# Patient Record
Sex: Male | Born: 1945 | Smoking: Former smoker
Health system: Southern US, Community
[De-identification: ages and names within clinical notes are randomized; demographics above are authoritative.]

## PROBLEM LIST (undated history)

## (undated) DIAGNOSIS — I10 Essential (primary) hypertension: Secondary | ICD-10-CM

## (undated) DIAGNOSIS — R0600 Dyspnea, unspecified: Secondary | ICD-10-CM

## (undated) DIAGNOSIS — I509 Heart failure, unspecified: Secondary | ICD-10-CM

## (undated) DIAGNOSIS — J439 Emphysema, unspecified: Secondary | ICD-10-CM

## (undated) DIAGNOSIS — R0602 Shortness of breath: Secondary | ICD-10-CM

## (undated) DIAGNOSIS — J449 Chronic obstructive pulmonary disease, unspecified: Secondary | ICD-10-CM

## (undated) DIAGNOSIS — M109 Gout, unspecified: Secondary | ICD-10-CM

## (undated) HISTORY — DX: Dyspnea, unspecified: R06.00

## (undated) HISTORY — PX: ABDOMINAL SURGERY: SHX537

## (undated) HISTORY — DX: Shortness of breath: R06.02

## (undated) HISTORY — PX: ARTERIAL BYPASS SURGRY: SHX557

## (undated) HISTORY — DX: Gout, unspecified: M10.9

---

## 2007-11-11 HISTORY — PX: CARDIAC CATHETERIZATION: SHX172

## 2008-04-05 ENCOUNTER — Ambulatory Visit: Payer: Self-pay | Admitting: Cardiology

## 2008-04-05 ENCOUNTER — Ambulatory Visit: Payer: Self-pay | Admitting: Internal Medicine

## 2008-04-05 DIAGNOSIS — D509 Iron deficiency anemia, unspecified: Secondary | ICD-10-CM

## 2008-04-05 DIAGNOSIS — I509 Heart failure, unspecified: Secondary | ICD-10-CM | POA: Insufficient documentation

## 2008-04-05 DIAGNOSIS — Z8679 Personal history of other diseases of the circulatory system: Secondary | ICD-10-CM | POA: Insufficient documentation

## 2008-04-05 DIAGNOSIS — R1084 Generalized abdominal pain: Secondary | ICD-10-CM

## 2008-04-05 LAB — CONVERTED CEMR LAB
ALT: 23 units/L (ref 0–53)
Amylase: 124 units/L (ref 27–131)
Basophils Absolute: 0.1 10*3/uL (ref 0.0–0.1)
Basophils Relative: 1.3 % — ABNORMAL HIGH (ref 0.0–1.0)
Bilirubin Urine: NEGATIVE
Calcium: 9.7 mg/dL (ref 8.4–10.5)
Creatinine, Ser: 1.1 mg/dL (ref 0.4–1.5)
Eosinophils Absolute: 0.1 10*3/uL (ref 0.0–0.7)
GFR calc Af Amer: 88 mL/min
GFR calc non Af Amer: 73 mL/min
H Pylori IgG: NEGATIVE
Hemoglobin: 13 g/dL (ref 13.0–17.0)
Ketones, ur: NEGATIVE mg/dL
MCHC: 34.2 g/dL (ref 30.0–36.0)
MCV: 94.6 fL (ref 78.0–100.0)
Monocytes Absolute: 0.7 10*3/uL (ref 0.1–1.0)
Neutro Abs: 6.2 10*3/uL (ref 1.4–7.7)
RBC: 4 M/uL — ABNORMAL LOW (ref 4.22–5.81)
Specific Gravity, Urine: 1.01 (ref 1.000–1.03)
TSH: 0.23 microintl units/mL — ABNORMAL LOW (ref 0.35–5.50)
Total Bilirubin: 0.6 mg/dL (ref 0.3–1.2)
Urine Glucose: NEGATIVE mg/dL
pH: 6 (ref 5.0–8.0)

## 2008-04-06 ENCOUNTER — Telehealth: Payer: Self-pay | Admitting: Internal Medicine

## 2008-05-05 ENCOUNTER — Encounter: Payer: Self-pay | Admitting: Internal Medicine

## 2014-09-07 ENCOUNTER — Emergency Department (HOSPITAL_COMMUNITY): Payer: Medicare Other

## 2014-09-07 ENCOUNTER — Encounter (HOSPITAL_COMMUNITY): Payer: Self-pay | Admitting: Emergency Medicine

## 2014-09-07 ENCOUNTER — Inpatient Hospital Stay (HOSPITAL_COMMUNITY)
Admission: EM | Admit: 2014-09-07 | Discharge: 2014-09-09 | DRG: 291 | Disposition: A | Discharge status: Home / Self Care | Payer: Medicare Other | Attending: Internal Medicine | Admitting: Internal Medicine

## 2014-09-07 DIAGNOSIS — I5043 Acute on chronic combined systolic (congestive) and diastolic (congestive) heart failure: Principal | ICD-10-CM | POA: Diagnosis present

## 2014-09-07 DIAGNOSIS — Z87891 Personal history of nicotine dependence: Secondary | ICD-10-CM | POA: Diagnosis not present

## 2014-09-07 DIAGNOSIS — E785 Hyperlipidemia, unspecified: Secondary | ICD-10-CM | POA: Diagnosis present

## 2014-09-07 DIAGNOSIS — Z9114 Patient's other noncompliance with medication regimen: Secondary | ICD-10-CM | POA: Diagnosis present

## 2014-09-07 DIAGNOSIS — N189 Chronic kidney disease, unspecified: Secondary | ICD-10-CM | POA: Diagnosis present

## 2014-09-07 DIAGNOSIS — N179 Acute kidney failure, unspecified: Secondary | ICD-10-CM | POA: Diagnosis present

## 2014-09-07 DIAGNOSIS — R0602 Shortness of breath: Secondary | ICD-10-CM

## 2014-09-07 DIAGNOSIS — I10 Essential (primary) hypertension: Secondary | ICD-10-CM | POA: Diagnosis present

## 2014-09-07 DIAGNOSIS — I5023 Acute on chronic systolic (congestive) heart failure: Secondary | ICD-10-CM

## 2014-09-07 DIAGNOSIS — J449 Chronic obstructive pulmonary disease, unspecified: Secondary | ICD-10-CM | POA: Diagnosis present

## 2014-09-07 DIAGNOSIS — J9601 Acute respiratory failure with hypoxia: Secondary | ICD-10-CM | POA: Diagnosis present

## 2014-09-07 DIAGNOSIS — I739 Peripheral vascular disease, unspecified: Secondary | ICD-10-CM | POA: Diagnosis present

## 2014-09-07 DIAGNOSIS — Z79899 Other long term (current) drug therapy: Secondary | ICD-10-CM | POA: Diagnosis not present

## 2014-09-07 DIAGNOSIS — D509 Iron deficiency anemia, unspecified: Secondary | ICD-10-CM

## 2014-09-07 DIAGNOSIS — Z7982 Long term (current) use of aspirin: Secondary | ICD-10-CM

## 2014-09-07 DIAGNOSIS — I429 Cardiomyopathy, unspecified: Secondary | ICD-10-CM | POA: Diagnosis present

## 2014-09-07 DIAGNOSIS — I5031 Acute diastolic (congestive) heart failure: Secondary | ICD-10-CM

## 2014-09-07 DIAGNOSIS — I129 Hypertensive chronic kidney disease with stage 1 through stage 4 chronic kidney disease, or unspecified chronic kidney disease: Secondary | ICD-10-CM | POA: Diagnosis present

## 2014-09-07 DIAGNOSIS — I509 Heart failure, unspecified: Secondary | ICD-10-CM

## 2014-09-07 DIAGNOSIS — I517 Cardiomegaly: Secondary | ICD-10-CM

## 2014-09-07 HISTORY — DX: Emphysema, unspecified: J43.9

## 2014-09-07 HISTORY — DX: Heart failure, unspecified: I50.9

## 2014-09-07 HISTORY — DX: Chronic obstructive pulmonary disease, unspecified: J44.9

## 2014-09-07 HISTORY — DX: Essential (primary) hypertension: I10

## 2014-09-07 LAB — COMPREHENSIVE METABOLIC PANEL
ALBUMIN: 3.8 g/dL (ref 3.5–5.2)
ALT: 18 U/L (ref 0–53)
AST: 20 U/L (ref 0–37)
Alkaline Phosphatase: 62 U/L (ref 39–117)
Anion gap: 13 (ref 5–15)
BUN: 18 mg/dL (ref 6–23)
CO2: 25 mEq/L (ref 19–32)
CREATININE: 1.61 mg/dL — AB (ref 0.50–1.35)
Calcium: 9.5 mg/dL (ref 8.4–10.5)
Chloride: 98 mEq/L (ref 96–112)
GFR calc Af Amer: 49 mL/min — ABNORMAL LOW (ref >90)
GFR calc non Af Amer: 43 mL/min — ABNORMAL LOW (ref >90)
Glucose, Bld: 131 mg/dL — ABNORMAL HIGH (ref 70–99)
Potassium: 3.6 mEq/L — ABNORMAL LOW (ref 3.7–5.3)
SODIUM: 136 meq/L — AB (ref 137–147)
Total Bilirubin: 0.7 mg/dL (ref 0.3–1.2)
Total Protein: 8.2 g/dL (ref 6.0–8.3)

## 2014-09-07 LAB — CBC WITH DIFFERENTIAL/PLATELET
Basophils Absolute: 0 10*3/uL (ref 0.0–0.1)
Basophils Relative: 0 % (ref 0–1)
EOS PCT: 1 % (ref 0–5)
Eosinophils Absolute: 0.1 10*3/uL (ref 0.0–0.7)
HCT: 38.5 % — ABNORMAL LOW (ref 39.0–52.0)
HEMOGLOBIN: 12.9 g/dL — AB (ref 13.0–17.0)
LYMPHS ABS: 1.3 10*3/uL (ref 0.7–4.0)
LYMPHS PCT: 12 % (ref 12–46)
MCH: 31.4 pg (ref 26.0–34.0)
MCHC: 33.5 g/dL (ref 30.0–36.0)
MCV: 93.7 fL (ref 78.0–100.0)
MONOS PCT: 7 % (ref 3–12)
Monocytes Absolute: 0.7 10*3/uL (ref 0.1–1.0)
Neutro Abs: 8.8 10*3/uL — ABNORMAL HIGH (ref 1.7–7.7)
Neutrophils Relative %: 80 % — ABNORMAL HIGH (ref 43–77)
Platelets: 347 10*3/uL (ref 150–400)
RBC: 4.11 MIL/uL — ABNORMAL LOW (ref 4.22–5.81)
RDW: 13.9 % (ref 11.5–15.5)
WBC: 10.9 10*3/uL — AB (ref 4.0–10.5)

## 2014-09-07 LAB — CBC
HCT: 34.9 % — ABNORMAL LOW (ref 39.0–52.0)
Hemoglobin: 11.5 g/dL — ABNORMAL LOW (ref 13.0–17.0)
MCH: 31.3 pg (ref 26.0–34.0)
MCHC: 33 g/dL (ref 30.0–36.0)
MCV: 95.1 fL (ref 78.0–100.0)
Platelets: 279 10*3/uL (ref 150–400)
RBC: 3.67 MIL/uL — ABNORMAL LOW (ref 4.22–5.81)
RDW: 13.9 % (ref 11.5–15.5)
WBC: 10.1 10*3/uL (ref 4.0–10.5)

## 2014-09-07 LAB — PRO B NATRIURETIC PEPTIDE
PRO B NATRI PEPTIDE: 3235 pg/mL — AB (ref 0–125)
Pro B Natriuretic peptide (BNP): 2357 pg/mL — ABNORMAL HIGH (ref 0–125)

## 2014-09-07 LAB — BASIC METABOLIC PANEL
Anion gap: 14 (ref 5–15)
BUN: 18 mg/dL (ref 6–23)
CO2: 20 mEq/L (ref 19–32)
Calcium: 8.7 mg/dL (ref 8.4–10.5)
Chloride: 101 mEq/L (ref 96–112)
Creatinine, Ser: 1.23 mg/dL (ref 0.50–1.35)
GFR calc Af Amer: 68 mL/min — ABNORMAL LOW (ref >90)
GFR calc non Af Amer: 59 mL/min — ABNORMAL LOW (ref >90)
Glucose, Bld: 165 mg/dL — ABNORMAL HIGH (ref 70–99)
Potassium: 3.7 mEq/L (ref 3.7–5.3)
Sodium: 135 mEq/L — ABNORMAL LOW (ref 137–147)

## 2014-09-07 LAB — PROTIME-INR
INR: 1.19 (ref 0.00–1.49)
PROTHROMBIN TIME: 15.3 s — AB (ref 11.6–15.2)

## 2014-09-07 LAB — TROPONIN I
Troponin I: <0.3 ng/mL (ref <0.30)
Troponin I: <0.3 ng/mL (ref <0.30)

## 2014-09-07 LAB — APTT: aPTT: 36 seconds (ref 24–37)

## 2014-09-07 LAB — MAGNESIUM: MAGNESIUM: 2.1 mg/dL (ref 1.5–2.5)

## 2014-09-07 LAB — TSH: TSH: 0.189 u[IU]/mL — AB (ref 0.350–4.500)

## 2014-09-07 MED ORDER — ENOXAPARIN SODIUM 40 MG/0.4ML ~~LOC~~ SOLN
40.0000 mg | SUBCUTANEOUS | Status: DC
  Administered 2014-09-08: 40 mg via SUBCUTANEOUS
  Filled 2014-09-07 (×2): qty 0.4

## 2014-09-07 MED ORDER — FUROSEMIDE 10 MG/ML IJ SOLN
40.0000 mg | Freq: Every day | INTRAMUSCULAR | Status: DC
  Administered 2014-09-07: 40 mg via INTRAVENOUS
  Filled 2014-09-07 (×2): qty 4

## 2014-09-07 MED ORDER — SODIUM CHLORIDE 0.9 % IJ SOLN: 3.0000 mL | INTRAMUSCULAR | Status: DC | PRN

## 2014-09-07 MED ORDER — POTASSIUM CHLORIDE CRYS ER 20 MEQ PO TBCR
20.0000 meq | EXTENDED_RELEASE_TABLET | Freq: Every day | ORAL | Status: DC
  Administered 2014-09-07 – 2014-09-09 (×3): 20 meq via ORAL
  Filled 2014-09-07 (×3): qty 1

## 2014-09-07 MED ORDER — SIMVASTATIN 20 MG PO TABS
20.0000 mg | ORAL_TABLET | Freq: Every day | ORAL | Status: DC
  Administered 2014-09-07 – 2014-09-09 (×3): 20 mg via ORAL
  Filled 2014-09-07 (×3): qty 1

## 2014-09-07 MED ORDER — SODIUM CHLORIDE 0.9 % IJ SOLN
3.0000 mL | Freq: Two times a day (BID) | INTRAMUSCULAR | Status: DC
  Administered 2014-09-07 – 2014-09-08 (×2): 3 mL via INTRAVENOUS

## 2014-09-07 MED ORDER — ONDANSETRON HCL 4 MG/2ML IJ SOLN: 4.0000 mg | Freq: Four times a day (QID) | INTRAMUSCULAR | Status: DC | PRN

## 2014-09-07 MED ORDER — IRBESARTAN 75 MG PO TABS
75.0000 mg | ORAL_TABLET | Freq: Every day | ORAL | Status: DC
  Administered 2014-09-07: 75 mg via ORAL
  Filled 2014-09-07 (×2): qty 1

## 2014-09-07 MED ORDER — SODIUM CHLORIDE 0.9 % IV SOLN: 250.0000 mL | INTRAVENOUS | Status: DC | PRN

## 2014-09-07 MED ORDER — IPRATROPIUM-ALBUTEROL 0.5-2.5 (3) MG/3ML IN SOLN
3.0000 mL | RESPIRATORY_TRACT | Status: DC
  Administered 2014-09-07: 3 mL via RESPIRATORY_TRACT
  Filled 2014-09-07: qty 3

## 2014-09-07 MED ORDER — FUROSEMIDE 10 MG/ML IJ SOLN
20.0000 mg | Freq: Once | INTRAMUSCULAR | Status: AC
  Administered 2014-09-07: 20 mg via INTRAVENOUS
  Filled 2014-09-07: qty 4

## 2014-09-07 MED ORDER — ACETAMINOPHEN 325 MG PO TABS: 650.0000 mg | ORAL_TABLET | ORAL | Status: DC | PRN

## 2014-09-07 MED ORDER — ENOXAPARIN SODIUM 30 MG/0.3ML ~~LOC~~ SOLN
30.0000 mg | SUBCUTANEOUS | Status: DC
  Administered 2014-09-07: 30 mg via SUBCUTANEOUS
  Filled 2014-09-07: qty 0.3

## 2014-09-07 MED ORDER — ASPIRIN EC 81 MG PO TBEC
162.0000 mg | DELAYED_RELEASE_TABLET | Freq: Every evening | ORAL | Status: DC
  Administered 2014-09-07 – 2014-09-08 (×2): 162 mg via ORAL
  Filled 2014-09-07 (×5): qty 2

## 2014-09-07 MED ORDER — TRIAMCINOLONE ACETONIDE 0.1 % EX CREA
1.0000 "application " | TOPICAL_CREAM | Freq: Two times a day (BID) | CUTANEOUS | Status: DC | PRN
  Administered 2014-09-07: 1 via TOPICAL
  Filled 2014-09-07: qty 15

## 2014-09-07 MED ORDER — CARVEDILOL 12.5 MG PO TABS
12.5000 mg | ORAL_TABLET | Freq: Two times a day (BID) | ORAL | Status: DC
  Administered 2014-09-07 – 2014-09-09 (×4): 12.5 mg via ORAL
  Filled 2014-09-07 (×6): qty 1

## 2014-09-07 MED ORDER — CYCLOBENZAPRINE HCL 5 MG PO TABS
5.0000 mg | ORAL_TABLET | Freq: Three times a day (TID) | ORAL | Status: DC | PRN
  Filled 2014-09-07: qty 1

## 2014-09-07 MED ORDER — FAMOTIDINE 20 MG PO TABS
20.0000 mg | ORAL_TABLET | Freq: Every day | ORAL | Status: DC
  Administered 2014-09-07 – 2014-09-09 (×3): 20 mg via ORAL
  Filled 2014-09-07 (×3): qty 1

## 2014-09-07 NOTE — H&P (Signed)
[facility] History and Physical  Robert Boyer XBJ:[id number] DOB: 06-10-1946 DOA: 09/07/2014  Referring physician: ER physician PCP: Cathlean Cower, MD   Chief Complaint: Shortness of breath  HPI:  68 year old male with past medical history of congestive heart failure, COPD, hypertension who presented to [facility] ED 09/07/2014 with worsening shortness of breath over the past few days prior to this admission associated with cough productive of occasional brownish sputum, congestion. Patient reported feeling shortness of breath at rest and especially on exertion. Patient also reported two-pillow orthopnea. Patient had associated chest tightness with coughing but no complaints of chest pain. No palpitations. No fevers or chills. No abdominal pain, nausea or vomiting. No lightheadedness or loss of consciousness. Patient reports not being on Lasix for heart failure management. He follows with the [facility] hospital.  In ED, blood pressure was 144/75, heart rate 109, RR 16-20, T max 97.6 F and oxygen saturation 94% on 2 L nasal cannula oxygen support. Blood work revealed hemoglobin of 11.5, normal kidney function, normal troponin level, BNP was 2357. Chest x-ray showed mild CHF. Patient received Lasix IV 20 mg in emergency room. He already feels better but still feels short of breath. Cardiology will see the patient in consultation.   Assessment & Plan    Principal problem: Acute diastolic CHF (congestive heart failure)  Patient was admitted for management of acute diastolic CHF. BNP on the admission is 2357. No previous values for comparison because patient follows in a [place] center.   Appreciate cardiology consult and their recommendations.  12-lead EKG showed sinus tachycardia. Initial troponin was within normal limits.  Patient already received Lasix 20 mg IV in ED. We will put order for Lasix 40 mg IV daily.  Per cardiology recommendation we can resume Coreg at half dose, 12.5 mg twice daily.  Resume irbesartan 75 mg daily.  Daily weights, strict intake and output. Replete electrolytes as needed.  Obtain 2-D echo.   May resume aspirin and statin therapy.  Active problems: Essential hypertension  Resume irbesartan 75 mg daily, resume her half dose, 12.5 mg twice daily.  DVT prophylaxis:   Lovenox subcutaneous ordered.  Radiological Exams on Admission: Dg Chest 2 View  09/07/2014   CLINICAL DATA:  Shortness of breath for 4 5 days, history of congestive failure and hypertension  EXAM: CHEST  2 VIEW  COMPARISON:  04/05/2008  FINDINGS: Cardiac shadow is within normal limits. Increased vascular congestion and mild interstitial edema is noted. No focal infiltrate or sizable effusion is seen. No bony abnormality is noted.  IMPRESSION: Mild CHF.   Electronically Signed   By: Inez Catalina M.D.   On: 09/07/2014 07:11     Code Status: Full Family Communication: Plan of care discussed with the patient and his family at the bedside Disposition Plan: Admit for further evaluation  Leisa Lenz, MD  [facility] Pager [phone]  Review of Systems:  Constitutional: Negative for fever, chills and malaise/fatigue. Negative for diaphoresis.  HENT: Negative for hearing loss, ear pain, nosebleeds, congestion, sore throat, neck pain, tinnitus and ear discharge.   Eyes: Negative for blurred vision, double vision, photophobia, pain, discharge and redness.  Respiratory: Positive for cough, sputum production, shortness of breath Cardiovascular: Positive for mild leg swelling, no palpitations and no chest pain at this time  Gastrointestinal: Negative for nausea, vomiting and abdominal pain. Negative for heartburn, constipation, blood in stool and melena.  Genitourinary: Negative for dysuria, urgency, frequency, hematuria and flank pain.  Musculoskeletal: Negative for myalgias, back pain, joint  pain and falls.  Skin: Negative for itching and rash.  Neurological: Negative for dizziness and  weakness. Negative for tingling, tremors, sensory change, speech change, focal weakness, loss of consciousness and headaches.  Endo/Heme/Allergies: Negative for environmental allergies and polydipsia. Does not bruise/bleed easily.  Psychiatric/Behavioral: Negative for suicidal ideas. The patient is not nervous/anxious.      Past Medical History  Diagnosis Date  . CHF (congestive heart failure)   . COPD (chronic obstructive pulmonary disease)   . Hypertension   . Emphysema/COPD    Past Surgical History  Procedure Laterality Date  . Arterial bypass surgry    . Abdominal surgery     Social History:  reports that he has quit smoking. He has never used smokeless tobacco. He reports that he does not drink alcohol or use illicit drugs.  Allergies  Allergen Reactions  . Penicillins Rash    Family History:  Family History  Problem Relation Age of Onset  . Cancer Mother   . Arthritis Other   . Cancer Other      Prior to Admission medications   Medication Sig Start Date End Date Taking? Authorizing Provider  aspirin EC 81 MG tablet Take 162 mg by mouth every evening.   Yes Historical Provider, MD  pseudoephedrine (SUDAFED) 30 MG tablet Take 30 mg by mouth every 4 (four) hours as needed for congestion.   Yes Historical Provider, MD  ranitidine (ZANTAC) 150 MG tablet Take 150 mg by mouth 2 (two) times daily.   Yes Historical Provider, MD  triamcinolone cream (KENALOG) 0.1 % Apply 1 application topically 2 (two) times daily as needed (for ezcema).   Yes Historical Provider, MD  carvedilol (COREG) 25 MG tablet Take 12.5 mg by mouth 2 (two) times daily with a meal.    Historical Provider, MD  hydrochlorothiazide (HYDRODIURIL) 25 MG tablet Take 25 mg by mouth daily.    Historical Provider, MD  simvastatin (ZOCOR) 20 MG tablet Take 20 mg by mouth daily.    Historical Provider, MD  valsartan (DIOVAN) 80 MG tablet Take 80 mg by mouth daily.    Historical Provider, MD   Physical Exam: Filed  Vitals:   09/07/14 0800 09/07/14 0816 09/07/14 0817 09/07/14 0947  BP: 153/88   140/86  Pulse: 113  124 112  Temp:    97.4 F (36.3 C)  TempSrc:    Oral  Resp: 26   20  Height:    5\' 6"  (1.676 m)  Weight:    78.109 kg (172 lb 3.2 oz)  SpO2: 93% 93% 90% 94%    Physical Exam  Constitutional: Appears well-developed and well-nourished. No distress.  HENT: Normocephalic. No tonsillar erythema or exudates Eyes: Conjunctivae and EOM are normal. PERRLA, no scleral icterus.  Neck: Normal ROM. Neck supple. No JVD. No tracheal deviation. No thyromegaly.  CVS: Tachycardic, S1/S2 +, no murmurs, no gallops, no carotid bruit.  Pulmonary: Appreciate wheezing and upper lung lobes, mild rales.  Abdominal: Soft. BS +,  no distension, tenderness, rebound or guarding.  Musculoskeletal: Normal range of motion. +1 lower extremity pitting edema and no tenderness.  Lymphadenopathy: No lymphadenopathy noted, cervical, inguinal. Neuro: Alert. Normal reflexes, muscle tone coordination. No focal neurologic deficits. Skin: Skin is warm and dry. No rash noted. Not diaphoretic. No erythema. No pallor.  Psychiatric: Normal mood and affect. Behavior, judgment, thought content normal.   Labs on Admission:  Basic Metabolic Panel:  Recent Labs Lab 09/07/14 0635  NA 135*  K 3.7  CL  101  CO2 20  GLUCOSE 165*  BUN 18  CREATININE 1.23  CALCIUM 8.7   Liver Function Tests: No results found for this basename: AST, ALT, ALKPHOS, BILITOT, PROT, ALBUMIN,  in the last 168 hours No results found for this basename: LIPASE, AMYLASE,  in the last 168 hours No results found for this basename: AMMONIA,  in the last 168 hours CBC:  Recent Labs Lab 09/07/14 0635  WBC 10.1  HGB 11.5*  HCT 34.9*  MCV 95.1  PLT 279   Cardiac Enzymes:  Recent Labs Lab 09/07/14 0635  TROPONINI <0.30   BNP: No components found with this basename: POCBNP,  CBG: No results found for this basename: GLUCAP,  in the last 168  hours  If 7PM-7AM, please contact night-coverage [email or web address] Password [email or web address] 09/07/2014, 11:24 AM

## 2014-09-07 NOTE — Consult Note (Signed)
Admit date: 09/07/2014 Referring Physician  Dr. Charlies Silvers Primary Physician Cathlean Cower, MD Primary Cardiologist  [facility], Dr. Leonides Sake Reason for Consultation  CHF  HPI: 68 year old male, patient of [facility] admitted with acute on chronic congestive heart failure. Over the past 5 days has been noticing increasing shortness of breath with increasing edema and cough worse with exertion and positive orthopnea. No medications over the past few months, "ran out".  In addition to prior history of heart failure, has COPD, sats were 90% at rest, nebulizer with partial improvement, BNP was elevated, IV Lasix administered.  We were consulted to assist with heart failure exacerbation.  He states that he has had 2 prior heart catheterizations, no prior PCI or stents. 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  Does not recall ejection fraction.    PMH:   Past Medical History  Diagnosis Date  . CHF (congestive heart failure)   . COPD (chronic obstructive pulmonary disease)   . Hypertension   . Emphysema/COPD     PSH:   Past Surgical History  Procedure Laterality Date  . Arterial bypass surgry    . Abdominal surgery     Allergies:  Penicillins Prior to Admit Meds:   Prior to Admission medications   Medication Sig Start Date End Date Taking? Authorizing Provider  aspirin EC 81 MG tablet Take 162 mg by mouth every evening.   Yes Historical Provider, MD  pseudoephedrine (SUDAFED) 30 MG tablet Take 30 mg by mouth every 4 (four) hours as needed for congestion.   Yes Historical Provider, MD  ranitidine (ZANTAC) 150 MG tablet Take 150 mg by mouth 2 (two) times daily.   Yes Historical Provider, MD  triamcinolone cream (KENALOG) 0.1 % Apply 1 application topically 2 (two) times daily as needed (for ezcema).   Yes Historical Provider, MD  carvedilol (COREG) 25 MG tablet Take 12.5 mg by mouth 2 (two) times daily with a meal.     Historical Provider, MD  hydrochlorothiazide (HYDRODIURIL) 25 MG tablet Take 25 mg by mouth daily.    Historical Provider, MD  simvastatin (ZOCOR) 20 MG tablet Take 20 mg by mouth daily.    Historical Provider, MD  valsartan (DIOVAN) 80 MG tablet Take 80 mg by mouth daily.    Historical Provider, MD   Fam HX:    Family History  Problem Relation Age of Onset  . Cancer Mother   . Arthritis Other   . Cancer Other    Social HX:    History   Social History  . Marital Status: Married    Spouse Name: N/A    Number of Children: N/A  . Years of Education: N/A   Occupational History  . Not on file.   Social History Main Topics  . Smoking status: Former Research scientist (life sciences)  . Smokeless tobacco: Never Used  . Alcohol Use: No  . Drug Use: No  . Sexual Activity: Not Currently   Other Topics Concern  . Not on file   Social History Narrative  . No narrative on file     ROS: Denies any bleeding, syncope, strokelike symptoms. Positive orthopnea, shortness of breath. All 11 ROS were addressed and are negative except what is stated in the HPI   Physical Exam: Blood pressure 140/86, pulse 112, temperature 97.4 F (36.3 C), temperature source Oral, resp. rate 20, height 5\' 6"  (1.676 m), weight 172 lb 3.2 oz (78.109 kg), SpO2 94.00%.  General: Well developed, well nourished, in no acute distress Head: Eyes PERRLA, No xanthomas.   Normal cephalic and atramatic  Lungs:  Mildly increased respiratory effort with minimal wheeze heard bilaterally. Heart:   Tachycardic, regular S1 S2 Pulses are 2+ & equal. No murmur, rubs, gallops.  No carotid bruit. No JVD.  No abdominal bruits.  Abdomen: Bowel sounds are positive, abdomen soft and non-tender without masses. No hepatosplenomegaly. Msk:  Back normal. Normal strength and tone for age. Extremities:  No clubbing, cyanosis, 2+ lower extremity edema.  DP +1 Neuro: Alert and oriented X 3, non-focal, MAE x 4 GU: Deferred Rectal: Deferred Psych:  Good affect,  responds appropriately      Labs: Lab Results  Component Value Date   WBC 10.1 09/07/2014   HGB 11.5* 09/07/2014   HCT 34.9* 09/07/2014   MCV 95.1 09/07/2014   PLT 279 09/07/2014     Recent Labs Lab 09/07/14 0635  NA 135*  K 3.7  CL 101  CO2 20  BUN 18  CREATININE 1.23  CALCIUM 8.7  GLUCOSE 165*    Recent Labs  09/07/14 0635  TROPONINI <0.30    Radiology:  Dg Chest 2 View  09/07/2014   CLINICAL DATA:  Shortness of breath for 4 5 days, history of congestive failure and hypertension  EXAM: CHEST  2 VIEW  COMPARISON:  04/05/2008  FINDINGS: Cardiac shadow is within normal limits. Increased vascular congestion and mild interstitial edema is noted. No focal infiltrate or sizable effusion is seen. No bony abnormality is noted.  IMPRESSION: Mild CHF.   Electronically Signed   By: Inez Catalina M.D.   On: 09/07/2014 07:11   Personally viewed.  EKG:  Sinus tachycardia rate 112 with left atrial enlargement, LVH, repolarization abnormality. Personally viewed.   ASSESSMENT/PLAN:    68 year old male with known history of chronic heart failure here with heart failure exacerbation in the setting of medication noncompliance.  1. Heart failure-we will check echocardiogram to determine systolic/diastolic. Continue with IV Lasix. Restart carvedilol and valsartan. It would not be unreasonable to start carvedilol half dose, 12.5 mg twice a day first. BNP 2300. Replete potassium as we are administering IV Lasix.  Describes prior heart catheterization 8 years ago, no percutaneous intervention, no significant CAD per [facility] Leonides Sake.  2. COPD-playing a role as well in shortness of breath. Continue treatment per primary team. Had nebulizer in emergency room.  3. Medication noncompliance-encourage use.  4. Mild anemia, hemoglobin 11.5.  5. Peripheral vascular disease-occluded distal aorta status post lower external bypass.  We will continue to follow.  He understands the  importance of medical compliance.   Candee Furbish, MD  09/07/2014  10:19 AM

## 2014-09-07 NOTE — ED Provider Notes (Signed)
CSN: [id number]     Arrival date & time 09/07/14  0551 History   First MD Initiated Contact with Patient 09/07/14 0615     Chief Complaint  Patient presents with  . Shortness of Breath     (Consider location/radiation/quality/duration/timing/severity/associated sxs/prior Treatment) HPI Pt is a 68yo male with hx of CHF, COPD, and HTN presenting to ED with c/o gradually worsening SOB over last 4-5 days with associated cough, congestion, and DOE.  Pt states he becomes winded while walking around his house and has had difficulty sleeping because he wakes up gasping for air. No previous dx of sleep apnea.  Also reports mild swelling in his feet bilaterally. Pt reports being out of his cholesterol and BP medications x1 month as he normally fills them online through the [place] but ordered them during a holiday and forgot to f/u.  Denies fever, chills, n/v/d. Reports being hospitalized 38yrs ago when he was first diagnosed with CHF.  Past Medical History  Diagnosis Date  . CHF (congestive heart failure)   . COPD (chronic obstructive pulmonary disease)   . Hypertension   . Emphysema/COPD    Past Surgical History  Procedure Laterality Date  . Arterial bypass surgry    . Abdominal surgery     Family History  Problem Relation Age of Onset  . Cancer Mother   . Arthritis Other   . Cancer Other    History  Substance Use Topics  . Smoking status: Former Research scientist (life sciences)  . Smokeless tobacco: Not on file  . Alcohol Use: No    Review of Systems  HENT: Positive for congestion.   Respiratory: Positive for cough, chest tightness and shortness of breath.   Cardiovascular: Negative for chest pain and palpitations.  Gastrointestinal: Positive for nausea. Negative for vomiting, abdominal pain and diarrhea.  All other systems reviewed and are negative.     Allergies  Penicillins  Home Medications   Prior to Admission medications   Medication Sig Start Date End Date Taking? Authorizing Provider   aspirin EC 81 MG tablet Take 162 mg by mouth every evening.   Yes Historical Provider, MD  pseudoephedrine (SUDAFED) 30 MG tablet Take 30 mg by mouth every 4 (four) hours as needed for congestion.   Yes Historical Provider, MD  ranitidine (ZANTAC) 150 MG tablet Take 150 mg by mouth 2 (two) times daily.   Yes Historical Provider, MD  triamcinolone cream (KENALOG) 0.1 % Apply 1 application topically 2 (two) times daily as needed (for ezcema).   Yes Historical Provider, MD  carvedilol (COREG) 25 MG tablet Take 12.5 mg by mouth 2 (two) times daily with a meal.    Historical Provider, MD  hydrochlorothiazide (HYDRODIURIL) 25 MG tablet Take 25 mg by mouth daily.    Historical Provider, MD  simvastatin (ZOCOR) 20 MG tablet Take 20 mg by mouth daily.    Historical Provider, MD  valsartan (DIOVAN) 80 MG tablet Take 80 mg by mouth daily.    Historical Provider, MD   BP 153/88  Pulse 124  Temp(Src) 97.6 F (36.4 C) (Oral)  Resp 26  SpO2 90% Physical Exam  Nursing note and vitals reviewed. Constitutional: He appears well-developed and well-nourished.  Pt sitting in exam bed, Pitts in place. NAD  HENT:  Head: Normocephalic and atraumatic.  Eyes: Conjunctivae are normal. No scleral icterus.  Neck: Normal range of motion.  Cardiovascular: Normal rate, regular rhythm and normal heart sounds.   Pulmonary/Chest: Effort normal. No respiratory distress. He has no wheezes.  He has rales. He exhibits no tenderness.  Nasal canula in place on 2L. No respiratory distress. Able to speak in full sentences. Decreased breath sounds in lower lung fields.  Abdominal: Soft. Bowel sounds are normal. He exhibits no distension and no mass. There is no tenderness. There is no rebound and no guarding.  Musculoskeletal: Normal range of motion. He exhibits edema. He exhibits no tenderness.  1+ pedal edema bilaterally   Neurological: He is alert.  Skin: Skin is warm and dry.    ED Course  Procedures (including critical care  time) Labs Review Labs Reviewed  CBC - Abnormal; Notable for the following:    RBC 3.67 (*)    Hemoglobin 11.5 (*)    HCT 34.9 (*)    All other components within normal limits  BASIC METABOLIC PANEL - Abnormal; Notable for the following:    Sodium 135 (*)    Glucose, Bld 165 (*)    GFR calc non Af Amer 59 (*)    GFR calc Af Amer 68 (*)    All other components within normal limits  PRO B NATRIURETIC PEPTIDE - Abnormal; Notable for the following:    Pro B Natriuretic peptide (BNP) 2357.0 (*)    All other components within normal limits  TROPONIN I    Imaging Review Dg Chest 2 View  09/07/2014   CLINICAL DATA:  Shortness of breath for 4 5 days, history of congestive failure and hypertension  EXAM: CHEST  2 VIEW  COMPARISON:  04/05/2008  FINDINGS: Cardiac shadow is within normal limits. Increased vascular congestion and mild interstitial edema is noted. No focal infiltrate or sizable effusion is seen. No bony abnormality is noted.  IMPRESSION: Mild CHF.   Electronically Signed   By: Inez Catalina M.D.   On: 09/07/2014 07:11     EKG Interpretation   Date/Time:  Thursday September 07 2014 06:00:40 EDT Ventricular Rate:  112 PR Interval:  161 QRS Duration: 97 QT Interval:  346 QTC Calculation: 472 R Axis:   16 Text Interpretation:  Sinus tachycardia Probable left atrial enlargement  LVH with secondary repolarization abnormality Baseline wander in lead(s)  V1 No old tracing to compare Confirmed by OTTER  MD, OLGA ([id number]) on  09/07/2014 6:11:13 AM      MDM   Final diagnoses:  SOB (shortness of breath)  CHF exacerbation    Pt presenting with signs and symptoms concerning for CHF exacerbation, COPD exacerbation, and/or pneumonia.  Low concern for PE.  Troponin and EKG: unremarkable. No evidence of MI.  Pt given duoneb tx while in ED which did help some with his SOB. CXR: mild CHF.  BNP- 2357. Pt does not have oxygen at home, however, while in ED, pt has needed to be on 2L [place].   Pt is 93% on RA, however, while getting an ambulatory O2, pt dropped to 90% HR up to 124 and pt reports feeling chest tightness and winded.   Discussed pt with Dr. Thurnell Garbe, will admit pt for CHF exacerbation. Pt has been given 2 duoneb tx and 20mg  IV lasix.    08:31AM consulted with Dr. Charlies Silvers, hospitalist, who agreed to examine pt and have him admitted to a tele bed for further evaluation and treatment of his CHF exacerbation. Pt is stable at this time.   Noland Fordyce, PA-C 09/07/14 [place], PA-C 09/07/14 0900

## 2014-09-07 NOTE — Progress Notes (Signed)
Echocardiogram 2D Echocardiogram has been performed.  Robert Boyer 09/07/2014, 2:38 PM

## 2014-09-07 NOTE — ED Notes (Signed)
Pt ambulated around dept with pulse ox.  Sats 93-90, hr 117-124.  Pt states he is winded on arrival back to room. PA notified.

## 2014-09-07 NOTE — ED Notes (Signed)
Pt states for the past 4 to 5 days he has been having shortness of breath  Pt has labored breathing upon arrival to triage  Pt has hx of CHF and emphysema  Pt states he has difficulty sleeping because he wakes up gasping for air  Pt states he has not been taking his blood pressure medication for about a month as he is out of it

## 2014-09-07 NOTE — ED Provider Notes (Signed)
Medical screening examination/treatment/procedure(s) were conducted as a shared visit with non-physician practitioner(s) and myself.  I personally evaluated the patient during the encounter.  68yo M, c/o increasing SOB for the past 4 to 5 days. Has been associated with peripheral edema and cough. SOB worsens on exertion and laying down flat. States he has not taken his meds in the past month because he "ran out." Pt has significant hx of COPD and CHF. A&O, NAD, lungs coarse at bases bilat, RRR, abd soft/NT, neuro non-focal, +1 pedal edema bilat. Sats 90% R/A at rest on arrival, increase to 97% on O2 2L N/C. Pt given neb tx x2 with partial improvement. CXR with mild CHF and BNP elevated; IV lasix given. Pt ambulated with Sats dropping to 90-93% R/A and pt c/o increasing SOB with increasing RR. Admit to medicine service.      EKG Interpretation   Date/Time:  Thursday September 07 2014 06:00:40 EDT Ventricular Rate:  112 PR Interval:  161 QRS Duration: 97 QT Interval:  346 QTC Calculation: 472 R Axis:   16 Text Interpretation:  Sinus tachycardia Probable left atrial enlargement  LVH with secondary repolarization abnormality Baseline wander in lead(s)  V1 No old tracing to compare Confirmed by OTTER  MD, OLGA ([id number]) on  09/07/2014 6:11:13 AM        Francine Graven, DO 09/07/14 0881

## 2014-09-08 LAB — BASIC METABOLIC PANEL
Anion gap: 14 (ref 5–15)
BUN: 24 mg/dL — ABNORMAL HIGH (ref 6–23)
CO2: 24 mEq/L (ref 19–32)
Calcium: 9.5 mg/dL (ref 8.4–10.5)
Chloride: 101 mEq/L (ref 96–112)
Creatinine, Ser: 1.67 mg/dL — ABNORMAL HIGH (ref 0.50–1.35)
GFR calc Af Amer: 47 mL/min — ABNORMAL LOW (ref >90)
GFR calc non Af Amer: 41 mL/min — ABNORMAL LOW (ref >90)
GLUCOSE: 107 mg/dL — AB (ref 70–99)
POTASSIUM: 4.2 meq/L (ref 3.7–5.3)
Sodium: 139 mEq/L (ref 137–147)

## 2014-09-08 MED ORDER — IRBESARTAN 150 MG PO TABS
150.0000 mg | ORAL_TABLET | Freq: Every day | ORAL | Status: DC
  Administered 2014-09-08 – 2014-09-09 (×2): 150 mg via ORAL
  Filled 2014-09-08 (×2): qty 1

## 2014-09-08 MED ORDER — FUROSEMIDE 20 MG PO TABS
20.0000 mg | ORAL_TABLET | Freq: Every day | ORAL | Status: DC
  Administered 2014-09-08 – 2014-09-09 (×2): 20 mg via ORAL
  Filled 2014-09-08 (×2): qty 1

## 2014-09-08 NOTE — Progress Notes (Addendum)
    Subjective:  Denies CP; dyspnea resolved   Objective:  Filed Vitals:   09/07/14 0947 09/07/14 1318 09/07/14 2047 09/08/14 0524  BP: 140/86 122/75 111/68 124/88  Pulse: 112 110 102 97  Temp: 97.4 F (36.3 C) 97.8 F (36.6 C) 97.5 F (36.4 C) 97.8 F (36.6 C)  TempSrc: Oral Oral Oral Oral  Resp: 20 20 20 20   Height: 5\' 6"  (1.676 m)     Weight: 172 lb 3.2 oz (78.109 kg)   167 lb 4.8 oz (75.887 kg)  SpO2: 94% 97% 95% 94%    Intake/Output from previous day:  Intake/Output Summary (Last 24 hours) at 09/08/14 0719 Last data filed at 09/08/14 0526  Gross per 24 hour  Intake    243 ml  Output   1475 ml  Net  -1232 ml    Physical Exam: Physical exam: Well-developed well-nourished in no acute distress.  Skin is warm and dry.  HEENT is normal.  Neck is supple.  Chest with diminished BS Cardiovascular exam is regular rate and rhythm.  Abdominal exam nontender or distended. No masses palpated. Extremities show no edema. neuro grossly intact    Lab Results: Basic Metabolic Panel:  Recent Labs  09/07/14 0635 09/07/14 1227 09/08/14 0350  NA 135* 136* 139  K 3.7 3.6* 4.2  CL 101 98 101  CO2 20 25 24   GLUCOSE 165* 131* 107*  BUN 18 18 24*  CREATININE 1.23 1.61* 1.67*  CALCIUM 8.7 9.5 9.5  MG  --  2.1  --    CBC:  Recent Labs  09/07/14 0635 09/07/14 1227  WBC 10.1 10.9*  NEUTROABS  --  8.8*  HGB 11.5* 12.9*  HCT 34.9* 38.5*  MCV 95.1 93.7  PLT 279 347   Cardiac Enzymes:  Recent Labs  09/07/14 1227 09/07/14 1543 09/07/14 2151  TROPONINI <0.30 <0.30 <0.30     Assessment/Plan:  1 acute on chronic systolic congestive heart failure-Symptoms are improving. Change lasix to 20 mg daily. 2 cardiomyopathy-ejection fraction 20% by echocardiogram. Patient states LV function has been reduced previously but I do not have records. Continue carvedilol. Increase Avapro to 150 mg daily. Titrate medications as tolerated by pulse and blood pressure as  outpatient. Obtain previous records concerning prior catheterization. Patient should FU 1-2 weeks following DC with his physicians at [facility]; if LV function chronically reduced and EF < 35, he should be considered for ICD. He likely will need nuclear study as outpt if no recent ischemia eval. 3 acute on chronic renal failure-follow renal function after DC 4 noncompliance-patient educated on importance of compliance. 5 COPD-Management per primary care. This appears to be playing a part in presentation. 6 Decreased TSH-management per primary care. Needs BMET checked one week after DC Kirk Ruths 09/08/2014, 7:19 AM

## 2014-09-08 NOTE — Progress Notes (Signed)
[facility] PROGRESS NOTE  Robert Boyer [id number] DOB: 17-Apr-1946 DOA: 09/07/2014 PCP: Cathlean Cower, MD  Assessment/Plan: Acute diastolic CHF (congestive heart failure)  Patient was admitted for management of acute diastolic CHF. BNP on the admission is 2357. No previous values for comparison because patient follows in a [place] center.  Appreciate cardiology consult and their recommendations.  12-lead EKG showed sinus tachycardia. Initial troponin was within normal limits.  Patient already received Lasix 20 mg IV in ED. Per cardiology recommendation we can resume Coreg at half dose, 12.5 mg twice daily. Resume irbesartan 75 mg daily.  Daily weights, strict intake and output. Replete electrolytes as needed.  2-D echo. Ef 20 % May resume aspirin and statin therapy. Change to oral lasix today. Request record from [place]. He has not been taking medication as he should. Needs to follow up with Cardiology for ICD.   Essential hypertension  Resume irbesartan 75 mg daily, resume her half dose, 12.5 mg twice daily.  DVT prophylaxis:  Lovenox subcutaneous ordered.  Low TSH; check Free T 3 and T 4.   Code Status: full code.  Family Communication: care discussed with patient and wife.  Disposition Plan: probably home 10-31   Consultants:  Cardiology  Procedures:  none  Antibiotics:  none  HPI/Subjective: Feeling better. No dyspnea  Objective: Filed Vitals:   09/08/14 1733  BP: 114/69  Pulse: 89  Temp:   Resp:     Intake/Output Summary (Last 24 hours) at 09/08/14 1801 Last data filed at 09/08/14 1403  Gross per 24 hour  Intake    480 ml  Output    250 ml  Net    230 ml   Filed Weights   09/07/14 0947 09/08/14 0524  Weight: 78.109 kg (172 lb 3.2 oz) 75.887 kg (167 lb 4.8 oz)    Exam:   General:  Alert in no distress.   Cardiovascular: S 1, S 2 RRR  Respiratory: CTA  Abdomen: Bs present, soft  Musculoskeletal: no edema.    Data Reviewed: Basic  Metabolic Panel:  Recent Labs Lab 09/07/14 0635 09/07/14 1227 09/08/14 0350  NA 135* 136* 139  K 3.7 3.6* 4.2  CL 101 98 101  CO2 20 25 24   GLUCOSE 165* 131* 107*  BUN 18 18 24*  CREATININE 1.23 1.61* 1.67*  CALCIUM 8.7 9.5 9.5  MG  --  2.1  --    Liver Function Tests:  Recent Labs Lab 09/07/14 1227  AST 20  ALT 18  ALKPHOS 62  BILITOT 0.7  PROT 8.2  ALBUMIN 3.8   No results found for this basename: LIPASE, AMYLASE,  in the last 168 hours No results found for this basename: AMMONIA,  in the last 168 hours CBC:  Recent Labs Lab 09/07/14 0635 09/07/14 1227  WBC 10.1 10.9*  NEUTROABS  --  8.8*  HGB 11.5* 12.9*  HCT 34.9* 38.5*  MCV 95.1 93.7  PLT 279 347   Cardiac Enzymes:  Recent Labs Lab 09/07/14 0635 09/07/14 1227 09/07/14 1543 09/07/14 2151  TROPONINI <0.30 <0.30 <0.30 <0.30   BNP (last 3 results)  Recent Labs  09/07/14 0635 09/07/14 1227  PROBNP 2357.0* 3235.0*   CBG: No results found for this basename: GLUCAP,  in the last 168 hours  No results found for this or any previous visit (from the past 240 hour(s)).   Studies: Dg Chest 2 View  09/07/2014   CLINICAL DATA:  Shortness of breath for 4 5 days, history of congestive failure  and hypertension  EXAM: CHEST  2 VIEW  COMPARISON:  04/05/2008  FINDINGS: Cardiac shadow is within normal limits. Increased vascular congestion and mild interstitial edema is noted. No focal infiltrate or sizable effusion is seen. No bony abnormality is noted.  IMPRESSION: Mild CHF.   Electronically Signed   By: Inez Catalina M.D.   On: 09/07/2014 07:11    Scheduled Meds: . aspirin EC  162 mg Oral QPM  . carvedilol  12.5 mg Oral BID WC  . enoxaparin (LOVENOX) injection  40 mg Subcutaneous Q24H  . famotidine  20 mg Oral Daily  . furosemide  20 mg Oral Daily  . irbesartan  150 mg Oral Daily  . potassium chloride  20 mEq Oral Daily  . simvastatin  20 mg Oral Daily  . sodium chloride  3 mL Intravenous Q12H    Continuous Infusions:   Principal Problem:   Acute respiratory failure with hypoxia Active Problems:   Acute diastolic CHF (congestive heart failure)   Essential hypertension   Dyslipidemia    Time spent: 25 minutes.     Niel Hummer A  [facility] Pager [phone]. If 7PM-7AM, please contact night-coverage at [email or web address], password [email or web address] 09/08/2014, 6:01 PM  LOS: 1 day

## 2014-09-09 DIAGNOSIS — I5023 Acute on chronic systolic (congestive) heart failure: Secondary | ICD-10-CM

## 2014-09-09 DIAGNOSIS — N179 Acute kidney failure, unspecified: Secondary | ICD-10-CM

## 2014-09-09 DIAGNOSIS — J9601 Acute respiratory failure with hypoxia: Secondary | ICD-10-CM

## 2014-09-09 LAB — BASIC METABOLIC PANEL
Anion gap: 13 (ref 5–15)
BUN: 35 mg/dL — ABNORMAL HIGH (ref 6–23)
CALCIUM: 9.2 mg/dL (ref 8.4–10.5)
CO2: 23 mEq/L (ref 19–32)
Chloride: 102 mEq/L (ref 96–112)
Creatinine, Ser: 1.81 mg/dL — ABNORMAL HIGH (ref 0.50–1.35)
GFR calc non Af Amer: 37 mL/min — ABNORMAL LOW (ref >90)
GFR, EST AFRICAN AMERICAN: 43 mL/min — AB (ref >90)
GLUCOSE: 111 mg/dL — AB (ref 70–99)
POTASSIUM: 4.4 meq/L (ref 3.7–5.3)
SODIUM: 138 meq/L (ref 137–147)

## 2014-09-09 LAB — T3, FREE: T3 FREE: 2.8 pg/mL (ref 2.3–4.2)

## 2014-09-09 LAB — T4, FREE: FREE T4: 1.44 ng/dL (ref 0.80–1.80)

## 2014-09-09 MED ORDER — IRBESARTAN 150 MG PO TABS: 150.0000 mg | ORAL_TABLET | Freq: Every day | ORAL | Status: DC

## 2014-09-09 MED ORDER — SIMVASTATIN 20 MG PO TABS: 20.0000 mg | ORAL_TABLET | Freq: Every day | ORAL | Status: DC

## 2014-09-09 MED ORDER — FUROSEMIDE 20 MG PO TABS: 20.0000 mg | ORAL_TABLET | Freq: Every day | ORAL | Status: AC

## 2014-09-09 MED ORDER — CARVEDILOL 25 MG PO TABS: 12.5000 mg | ORAL_TABLET | Freq: Two times a day (BID) | ORAL | Status: AC

## 2014-09-09 NOTE — Discharge Summary (Signed)
Physician Discharge Summary  Coyle Stordahl [id number] DOB: 1946-02-18 DOA: 09/07/2014  PCP: Cathlean Cower, MD  Admit date: 09/07/2014 Discharge date: 09/09/2014  Time spent: 35 minutes  Recommendations for Outpatient Follow-up:  1. Needs B-met to follow renal function. If worsening renal function will need to adjust lasix and or hold Valsartan.  2. Need TSHi n 4 weeks.  3. Needs to follow up with primary cardiologist, review priors EF. Might need ICD>   Discharge Diagnoses:    Acute respiratory failure with hypoxia   Acute diastolic CHF (congestive heart failure)   Essential hypertension   Dyslipidemia   Discharge Condition: Stable.   Diet recommendation: Heart Healthy  Filed Weights   09/07/14 0947 09/08/14 0524 09/09/14 0434  Weight: 78.109 kg (172 lb 3.2 oz) 75.887 kg (167 lb 4.8 oz) 76.522 kg (168 lb 11.2 oz)    History of present illness:  68 year old male with past medical history of congestive heart failure, COPD, hypertension who presented to [facility] ED 09/07/2014 with worsening shortness of breath over the past few days prior to this admission associated with cough productive of occasional brownish sputum, congestion. Patient reported feeling shortness of breath at rest and especially on exertion. Patient also reported two-pillow orthopnea. Patient had associated chest tightness with coughing but no complaints of chest pain. No palpitations. No fevers or chills. No abdominal pain, nausea or vomiting. No lightheadedness or loss of consciousness. Patient reports not being on Lasix for heart failure management. He follows with the [facility] hospital.  In ED, blood pressure was 144/75, heart rate 109, RR 16-20, T max 97.6 F and oxygen saturation 94% on 2 L nasal cannula oxygen support. Blood work revealed hemoglobin of 11.5, normal kidney function, normal troponin level, BNP was 2357. Chest x-ray showed mild CHF. Patient received Lasix IV 20 mg in emergency room. He already feels better  but still feels short of breath. Cardiology will see the patient in consultation.   Hospital Course:  Acute Systolic  CHF (congestive heart failure)  Patient was admitted for management of acute systolic  CHF. BNP on the admission is 2357. No previous values for comparison because patient follows in a [place] center.  Appreciate cardiology consult and their recommendations.  Patient already received Lasix 20 mg IV in ED.  Per cardiology recommendation we can resume Coreg at half dose, 12.5 mg twice daily. Resume irbesartan 150 mg daily.  2-D echo. Ef 20 %  May resume aspirin and statin therapy.  Change to oral lasix today.  Request record from [place]. He has not been taking medication as he should. Needs to follow up with Cardiology for ICD.   Essential hypertension  Resume irbesartan increase to 150  mg daily, resume her half dose, 12.5 mg twice daily. DVT prophylaxis:  Lovenox subcutaneous ordered.  Low TSH;  Free T 3 and T 4 normal  Acute on Chronic renal failure. Due to diuresis. CM will help arrange B-met for Monday. [facility]    Procedures:  ECHO; Ef 20 %  Consultations:  Cradiology  Discharge Exam: Filed Vitals:   09/09/14 0453  BP: 104/64  Pulse:   Temp: 97.4 F (36.3 C)  Resp: 20    General: No distress Cardiovascular: S 1, S 2 RRR Respiratory: CTA  Discharge Instructions You were cared for by a hospitalist during your hospital stay. If you have any questions about your discharge medications or the care you received while you were in the hospital after you are discharged,  you can call the unit and asked to speak with the hospitalist on call if the hospitalist that took care of you is not available. Once you are discharged, your primary care physician will handle any further medical issues. Please note that NO REFILLS for any discharge medications will be authorized once you are discharged, as it is imperative that you return to your primary care  physician (or establish a relationship with a primary care physician if you do not have one) for your aftercare needs so that they can reassess your need for medications and monitor your lab values.  Discharge Instructions   Diet - low sodium heart healthy    Complete by:  As directed      Increase activity slowly    Complete by:  As directed           Current Discharge Medication List    START taking these medications   Details  furosemide (LASIX) 20 MG tablet Take 1 tablet (20 mg total) by mouth daily. Qty: 30 tablet, Refills: 0    irbesartan (AVAPRO) 150 MG tablet Take 1 tablet (150 mg total) by mouth daily. Qty: 30 tablet, Refills: 0      CONTINUE these medications which have CHANGED   Details  carvedilol (COREG) 25 MG tablet Take 0.5 tablets (12.5 mg total) by mouth 2 (two) times daily with a meal. Qty: 60 tablet, Refills: 0    simvastatin (ZOCOR) 20 MG tablet Take 1 tablet (20 mg total) by mouth daily. Qty: 30 tablet, Refills: 0      CONTINUE these medications which have NOT CHANGED   Details  aspirin EC 81 MG tablet Take 162 mg by mouth every evening.    ranitidine (ZANTAC) 150 MG tablet Take 150 mg by mouth 2 (two) times daily.    triamcinolone cream (KENALOG) 0.1 % Apply 1 application topically 2 (two) times daily as needed (for ezcema).      STOP taking these medications     pseudoephedrine (SUDAFED) 30 MG tablet      hydrochlorothiazide (HYDRODIURIL) 25 MG tablet      valsartan (DIOVAN) 80 MG tablet        Allergies  Allergen Reactions  . Penicillins Rash   Follow-up Information   Follow up with Cathlean Cower, MD. (You need B-met in 3 days. )    Specialties:  Internal Medicine, Radiology   Contact information:   [address] [place] [address] [phone]        The results of significant diagnostics from this hospitalization (including imaging, microbiology, ancillary and laboratory) are listed below for reference.    Significant  Diagnostic Studies: Dg Chest 2 View  09/07/2014   CLINICAL DATA:  Shortness of breath for 4 5 days, history of congestive failure and hypertension  EXAM: CHEST  2 VIEW  COMPARISON:  04/05/2008  FINDINGS: Cardiac shadow is within normal limits. Increased vascular congestion and mild interstitial edema is noted. No focal infiltrate or sizable effusion is seen. No bony abnormality is noted.  IMPRESSION: Mild CHF.   Electronically Signed   By: Inez Catalina M.D.   On: 09/07/2014 07:11    Microbiology: No results found for this or any previous visit (from the past 240 hour(s)).   Labs: Basic Metabolic Panel:  Recent Labs Lab 09/07/14 0635 09/07/14 1227 09/08/14 0350 09/09/14 0355  NA 135* 136* 139 138  K 3.7 3.6* 4.2 4.4  CL 101 98 101 102  CO2 20 25  24 23  GLUCOSE 165* 131* 107* 111*  BUN 18 18 24* 35*  CREATININE 1.23 1.61* 1.67* 1.81*  CALCIUM 8.7 9.5 9.5 9.2  MG  --  2.1  --   --    Liver Function Tests:  Recent Labs Lab 09/07/14 1227  AST 20  ALT 18  ALKPHOS 62  BILITOT 0.7  PROT 8.2  ALBUMIN 3.8   No results found for this basename: LIPASE, AMYLASE,  in the last 168 hours No results found for this basename: AMMONIA,  in the last 168 hours CBC:  Recent Labs Lab 09/07/14 0635 09/07/14 1227  WBC 10.1 10.9*  NEUTROABS  --  8.8*  HGB 11.5* 12.9*  HCT 34.9* 38.5*  MCV 95.1 93.7  PLT 279 347   Cardiac Enzymes:  Recent Labs Lab 09/07/14 0635 09/07/14 1227 09/07/14 1543 09/07/14 2151  TROPONINI <0.30 <0.30 <0.30 <0.30   BNP: BNP (last 3 results)  Recent Labs  09/07/14 0635 09/07/14 1227  PROBNP 2357.0* 3235.0*   CBG: No results found for this basename: GLUCAP,  in the last 168 hours     Signed:  Niel Hummer A  [facility] 09/09/2014, 9:51 AM

## 2014-09-09 NOTE — Progress Notes (Signed)
CARE MANAGEMENT NOTE 09/09/2014  Patient:  Robert Boyer, Robert Boyer   Account Number:  [id number]  Date Initiated:  09/07/2014  Documentation initiated by:  Karl Bales  Subjective/Objective Assessment:   pt admitted with cco SOB     Action/Plan:   from home   Anticipated DC Date:  09/11/2014   Anticipated DC Plan:  [place]  CM consult      Choice offered to / List presented to:             Status of service:  Completed, signed off Medicare Important Message given?  YES (If response is "NO", the following Medicare IM given date fields will be blank) Date Medicare IM given:  09/09/2014 Medicare IM given by:  [facility] Date Additional Medicare IM given:   Additional Medicare IM given by:    Discharge Disposition:  HOME/SELF CARE  Per UR Regulation:  Reviewed for med. necessity/level of care/duration of stay  If discussed at [place] of Stay Meetings, dates discussed:    Comments:  09/09/2014 1100 NCM spoke to pt and states he goes to the [facility], and has a new PCP at [place]. Provided pt with contact info for [facility] to contact Monday at 9:00 am to schedule a follow up appt or he can contact Dr. Cathlean Cower. States he will contact [facility] on Monday to arrange an appt. And schedule appt at [facility]. Explained BMET is needed. Will contact [facility] on 11/2 to fax dc summary to his new physician. Jonnie Finner RN CCM Case Mgmt phone [phone]  09/07/14 MMcGibboney, RN, BSN Chart reveiwed.

## 2014-09-09 NOTE — Progress Notes (Addendum)
       Patient Name: Robert Boyer Date of Encounter: 09/09/2014    SUBJECTIVE: Breathing is much improved. No chest pain.  TELEMETRY:  Sinus rhythm Filed Vitals:   09/08/14 1733 09/08/14 2046 09/09/14 0434 09/09/14 0453  BP: 114/69 112/72  104/64  Pulse: 89 85    Temp:  97.5 F (36.4 C)  97.4 F (36.3 C)  TempSrc:  Oral  Oral  Resp:  20  20  Height:      Weight:   168 lb 11.2 oz (76.522 kg)   SpO2:  100%  99%    Intake/Output Summary (Last 24 hours) at 09/09/14 0806 Last data filed at 09/08/14 1806  Gross per 24 hour  Intake    480 ml  Output    350 ml  Net    130 ml   LABS: Basic Metabolic Panel:  Recent Labs  09/07/14 0635 09/07/14 1227 09/08/14 0350 09/09/14 0355  NA 135* 136* 139 138  K 3.7 3.6* 4.2 4.4  CL 101 98 101 102  CO2 20 25 24 23   GLUCOSE 165* 131* 107* 111*  BUN 18 18 24* 35*  CREATININE 1.23 1.61* 1.67* 1.81*  CALCIUM 8.7 9.5 9.5 9.2  MG  --  2.1  --   --    CBC:  Recent Labs  09/07/14 0635 09/07/14 1227  WBC 10.1 10.9*  NEUTROABS  --  8.8*  HGB 11.5* 12.9*  HCT 34.9* 38.5*  MCV 95.1 93.7  PLT 279 347   Cardiac Enzymes:  Recent Labs  09/07/14 1227 09/07/14 1543 09/07/14 2151  TROPONINI <0.30 <0.30 <0.30   BNP    Component Value Date/Time   PROBNP 3235.0* 09/07/2014 1227     Radiology/Studies:  No new data  Physical Exam: Blood pressure 104/64, pulse 85, temperature 97.4 F (36.3 C), temperature source Oral, resp. rate 20, height 5\' 6"  (1.676 m), weight 168 lb 11.2 oz (76.522 kg), SpO2 99.00%. Weight change: -3 lb 8 oz (-1.588 kg)  Wt Readings from Last 3 Encounters:  09/09/14 168 lb 11.2 oz (76.522 kg)  04/05/08 119 lb (53.978 kg)   Up in bathroom watching up without limitations. Not wearing oxygen. Soft one of 6 systolic murmur without gallop (patient examined while standing) Clear lung fields anteriorly and posteriorly No obvious JVD No peripheral edema  ASSESSMENT:  1. Acute on chronic systolic  heart failure, with improved dyspnea. The patient is only 1.2 L negative since admission. LVEF is 20%. 2. COPD 3. Acute on chronic kidney injury, possibly related to diuresis. 4. Medication noncompliance  Plan:  1. Patient seems clinically compensated but is at high risk for readmission due to absence of data concerning prior workup and therapy. 2. Agree with Dr. Jacalyn Lefevre recommendation for loop diuretic rather than hydrochlorothiazide. Also agree with the outline for follow-up, particularly as it applies to renal function. 3. It is very important for the patient to be seen by his physicians at the [facility] ASAP after discharge to avoid recurrent admission for either dehydration or recurrent failure. 4. Significant conversation with the patient concerning follow-up without delay at the [place] upon discharge. Valerie Roys W 09/09/2014, 8:06 AM

## 2014-09-09 NOTE — ED Provider Notes (Signed)
Medical screening examination/treatment/procedure(s) were conducted as a shared visit with non-physician practitioner(s) and myself.  I personally evaluated the patient during the encounter. Please see my previous note.   EKG Interpretation   Date/Time:  Thursday September 07 2014 06:00:40 EDT Ventricular Rate:  112 PR Interval:  161 QRS Duration: 97 QT Interval:  346 QTC Calculation: 472 R Axis:   16 Text Interpretation:  Sinus tachycardia Probable left atrial enlargement  LVH with secondary repolarization abnormality Baseline wander in lead(s)  V1 No old tracing to compare Confirmed by OTTER  MD, OLGA ([id number]) on  09/07/2014 6:11:13 AM        Francine Graven, DO 09/09/14 1356

## 2014-09-11 ENCOUNTER — Telehealth: Payer: Self-pay | Admitting: *Deleted

## 2014-09-11 NOTE — Telephone Encounter (Signed)
Called pt concerning TCM no answer LMOM RTC...Robert Boyer

## 2014-09-11 NOTE — Progress Notes (Signed)
CARE MANAGEMENT NOTE 09/11/2014  Patient:  Robert Boyer, Robert Boyer   Account Number:  [id number]  Date Initiated:  09/07/2014  Documentation initiated by:  Karl Bales  Subjective/Objective Assessment:   pt admitted with cco SOB     Action/Plan:   from home   Anticipated DC Date:  09/11/2014   Anticipated DC Plan:  [place]  CM consult      Choice offered to / List presented to:             Status of service:  Completed, signed off Medicare Important Message given?  YES (If response is "NO", the following Medicare IM given date fields will be blank) Date Medicare IM given:  09/09/2014 Medicare IM given by:  [facility] Date Additional Medicare IM given:   Additional Medicare IM given by:    Discharge Disposition:  HOME/SELF CARE  Per UR Regulation:  Reviewed for med. necessity/level of care/duration of stay  If discussed at [place] of Stay Meetings, dates discussed:    Comments:  09/11/2014 1400 NCM contacted [facility] and made aware of dc from hospital on 10/31. Spoke to liaison, April. She will alert his PCP and have them follow up with NCM. NCM contacted pt and got unidentifiable vm. No message left. Jonnie Finner RN CCM Case Mgmt phone [phone]  09/09/2014 1100 NCM spoke to pt and states he goes to the [facility], and has a new PCP at [place]. Provided pt with contact info for [facility] to contact Monday at 9:00 am to schedule a follow up appt or he can contact Dr. Cathlean Cower. States he will contact [facility] on Monday to arrange an appt. And schedule appt at [facility]. Explained BMET is needed. Will contact [facility] on 11/2 to fax dc summary to his new physician. Jonnie Finner RN CCM Case Mgmt phone [phone]  09/07/14 MMcGibboney, RN, BSN Chart reveiwed.

## 2014-09-12 NOTE — Telephone Encounter (Signed)
Wife return call she stated since husband has no insurance he is seeing the [place] in [place]...Johny Chess

## 2015-08-07 ENCOUNTER — Encounter (HOSPITAL_COMMUNITY): Payer: Self-pay | Admitting: Emergency Medicine

## 2015-08-07 ENCOUNTER — Emergency Department (HOSPITAL_COMMUNITY)
Admission: EM | Admit: 2015-08-07 | Discharge: 2015-08-07 | Disposition: A | Discharge status: Home / Self Care | Payer: Medicare Other | Attending: Emergency Medicine | Admitting: Emergency Medicine

## 2015-08-07 DIAGNOSIS — Z87891 Personal history of nicotine dependence: Secondary | ICD-10-CM | POA: Diagnosis not present

## 2015-08-07 DIAGNOSIS — M79672 Pain in left foot: Secondary | ICD-10-CM | POA: Diagnosis present

## 2015-08-07 DIAGNOSIS — Z880 Allergy status to penicillin: Secondary | ICD-10-CM | POA: Insufficient documentation

## 2015-08-07 DIAGNOSIS — J449 Chronic obstructive pulmonary disease, unspecified: Secondary | ICD-10-CM | POA: Diagnosis not present

## 2015-08-07 DIAGNOSIS — Z9889 Other specified postprocedural states: Secondary | ICD-10-CM | POA: Diagnosis not present

## 2015-08-07 DIAGNOSIS — Z79899 Other long term (current) drug therapy: Secondary | ICD-10-CM | POA: Diagnosis not present

## 2015-08-07 DIAGNOSIS — I509 Heart failure, unspecified: Secondary | ICD-10-CM | POA: Insufficient documentation

## 2015-08-07 DIAGNOSIS — Z7982 Long term (current) use of aspirin: Secondary | ICD-10-CM | POA: Insufficient documentation

## 2015-08-07 DIAGNOSIS — I10 Essential (primary) hypertension: Secondary | ICD-10-CM | POA: Diagnosis not present

## 2015-08-07 MED ORDER — KETOROLAC TROMETHAMINE 60 MG/2ML IM SOLN
60.0000 mg | Freq: Once | INTRAMUSCULAR | Status: AC
  Administered 2015-08-07: 60 mg via INTRAMUSCULAR
  Filled 2015-08-07: qty 2

## 2015-08-07 MED ORDER — TRAMADOL HCL 50 MG PO TABS: 50.0000 mg | ORAL_TABLET | Freq: Two times a day (BID) | ORAL | Status: DC | PRN

## 2015-08-07 MED ORDER — HYDROCODONE-ACETAMINOPHEN 5-325 MG PO TABS
2.0000 | ORAL_TABLET | Freq: Once | ORAL | Status: AC
  Administered 2015-08-07: 2 via ORAL
  Filled 2015-08-07: qty 2

## 2015-08-07 NOTE — Discharge Instructions (Signed)
Neuropathic Pain Robert Boyer, take ibuprofen '800mg'$  every 8 hours as needed for pain.  If symptoms become severe, take one tramadol.  See a primary care doctor within 3 days for close follow up.  If symptoms worsen, come back to the ED immediately. Thank you. We often think that pain has a physical cause. If we get rid of the cause, the pain should go away. Nerves themselves can also cause pain. It is called neuropathic pain, which means nerve abnormality. It may be difficult for the patients who have it and for the treating caregivers. Pain is usually described as acute (short-lived) or chronic (long-lasting). Acute pain is related to the physical sensations caused by an injury. It can last from a few seconds to many weeks, but it usually goes away when normal healing occurs. Chronic pain lasts beyond the typical healing time. With neuropathic pain, the nerve fibers themselves may be damaged or injured. They then send incorrect signals to other pain centers. The pain you feel is real, but the cause is not easy to find.  CAUSES  Chronic pain can result from diseases, such as diabetes and shingles (an infection related to chickenpox), or from trauma, surgery, or amputation. It can also happen without any known injury or disease. The nerves are sending pain messages, even though there is no identifiable cause for such messages.   Other common causes of neuropathy include diabetes, phantom limb pain, or Regional Pain Syndrome (RPS).  As with all forms of chronic back pain, if neuropathy is not correctly treated, there can be a number of associated problems that lead to a downward cycle for the patient. These include depression, sleeplessness, feelings of fear and anxiety, limited social interaction and inability to do normal daily activities or work.  The most dramatic and mysterious example of neuropathic pain is called "phantom limb syndrome." This occurs when an arm or a leg has been removed because of  illness or injury. The brain still gets pain messages from the nerves that originally carried impulses from the missing limb. These nerves now seem to misfire and cause troubling pain.  Neuropathic pain often seems to have no cause. It responds poorly to standard pain treatment. Neuropathic pain can occur after:  Shingles (herpes zoster virus infection).  A lasting burning sensation of the skin, caused usually by injury to a peripheral nerve.  Peripheral neuropathy which is widespread nerve damage, often caused by diabetes or alcoholism.  Phantom limb pain following an amputation.  Facial nerve problems (trigeminal neuralgia).  Multiple sclerosis.  Reflex sympathetic dystrophy.  Pain which comes with cancer and cancer chemotherapy.  Entrapment neuropathy such as when pressure is put on a nerve such as in carpal tunnel syndrome.  Back, leg, and hip problems (sciatica).  Spine or back surgery.  HIV Infection or AIDS where nerves are infected by viruses. Your caregiver can explain items in the above list which may apply to you. SYMPTOMS  Characteristics of neuropathic pain are:  Severe, sharp, electric shock-like, shooting, lightening-like, knife-like.  Pins and needles sensation.  Deep burning, deep cold, or deep ache.  Persistent numbness, tingling, or weakness.  Pain resulting from light touch or other stimulus that would not usually cause pain.  Increased sensitivity to something that would normally cause pain, such as a pinprick. Pain may persist for months or years following the healing of damaged tissues. When this happens, pain signals no longer sound an alarm about current injuries or injuries about to happen. Instead, the alarm  system itself is not working correctly.  Neuropathic pain may get worse instead of better over time. For some people, it can lead to serious disability. It is important to be aware that severe injury in a limb can occur without a proper,  protective pain response.Burns, cuts, and other injuries may go unnoticed. Without proper treatment, these injuries can become infected or lead to further disability. Take any injury seriously, and consult your caregiver for treatment. DIAGNOSIS  When you have a pain with no known cause, your caregiver will probably ask some specific questions:   Do you have any other conditions, such as diabetes, shingles, multiple sclerosis, or HIV infection?  How would you describe your pain? (Neuropathic pain is often described as shooting, stabbing, burning, or searing.)  Is your pain worse at any time of the day? (Neuropathic pain is usually worse at night.)  Does the pain seem to follow a certain physical pathway?  Does the pain come from an area that has missing or injured nerves? (An example would be phantom limb pain.)  Is the pain triggered by minor things such as rubbing against the sheets at night? These questions often help define the type of pain involved. Once your caregiver knows what is happening, treatment can begin. Anticonvulsant, antidepressant drugs, and various pain relievers seem to work in some cases. If another condition, such as diabetes is involved, better management of that disorder may relieve the neuropathic pain.  TREATMENT  Neuropathic pain is frequently long-lasting and tends not to respond to treatment with narcotic type pain medication. It may respond well to other drugs such as antiseizure and antidepressant medications. Usually, neuropathic problems do not completely go away, but partial improvement is often possible with proper treatment. Your caregivers have large numbers of medications available to treat you. Do not be discouraged if you do not get immediate relief. Sometimes different medications or a combination of medications will be tried before you receive the results you are hoping for. See your caregiver if you have pain that seems to be coming from nowhere and does  not go away. Help is available.  SEEK IMMEDIATE MEDICAL CARE IF:   There is a sudden change in the quality of your pain, especially if the change is on only one side of the body.  You notice changes of the skin, such as redness, black or purple discoloration, swelling, or an ulcer.  You cannot move the affected limbs. Document Released: 07/24/2004 Document Revised: 01/19/2012 Document Reviewed: 07/24/2004 [facility] Patient Information 2015 [place], [place]. This information is not intended to replace advice given to you by your health care provider. Make sure you discuss any questions you have with your health care provider.

## 2015-08-07 NOTE — ED Notes (Signed)
Pt states his feet hurt off and on for some time   Pt states today he felt kind of achy all over but that resolved but his left ankle and foot continue to hurt  Pt states he is unable to move it any way to get it comfortable  Pt states he has swelling in his feet off and on

## 2015-08-07 NOTE — ED Provider Notes (Signed)
CSN: [id number]     Arrival date & time 08/07/15  0358 History   First MD Initiated Contact with Patient 08/07/15 [phone]     Chief Complaint  Patient presents with  . Foot Pain     (Consider location/radiation/quality/duration/timing/severity/associated sxs/prior Treatment) HPI  Robert Boyer is a 69 y.o. male with no sig PMH, here with foot pain.  He states this began tonight with sudden onset.  This has been coming and going after the last several weeks but never this severe.  He only took aspirin tonight for pain.  He denies any trauma to the area.  He has no history of gout or blood clots.  He has no symptoms in the other foot.  He feels as if something tight is wrapping around his foot.  Movement and light touch make it worse.  He has no further complaints.  10 Systems reviewed and are negative for acute change except as noted in the HPI.     Past Medical History  Diagnosis Date  . CHF (congestive heart failure)   . COPD (chronic obstructive pulmonary disease)   . Hypertension   . Emphysema/COPD    Past Surgical History  Procedure Laterality Date  . Arterial bypass surgry    . Abdominal surgery     Family History  Problem Relation Age of Onset  . Cancer Mother   . Arthritis Other   . Cancer Other    Social History  Substance Use Topics  . Smoking status: Former Research scientist (life sciences)  . Smokeless tobacco: Never Used  . Alcohol Use: No    Review of Systems    Allergies  Simvastatin and Penicillins  Home Medications   Prior to Admission medications   Medication Sig Start Date End Date Taking? Authorizing Camiya Vinal  aspirin 325 MG tablet Take 325 mg by mouth daily.   Yes Historical Hank Walling, MD  calcium carbonate (TUMS - DOSED IN MG ELEMENTAL CALCIUM) 500 MG chewable tablet Chew 1 tablet by mouth daily.   Yes Historical Harshith Pursell, MD  carvedilol (COREG) 25 MG tablet Take 0.5 tablets (12.5 mg total) by mouth 2 (two) times daily with a meal. 09/09/14  Yes Belkys A Regalado, MD   furosemide (LASIX) 20 MG tablet Take 1 tablet (20 mg total) by mouth daily. 09/09/14  Yes Belkys A Regalado, MD  ranitidine (ZANTAC) 150 MG tablet Take 150 mg by mouth 2 (two) times daily.   Yes Historical Ashonti Leandro, MD  triamcinolone cream (KENALOG) 0.1 % Apply 1 application topically 2 (two) times daily as needed (for ezcema).   Yes Historical Arla Boutwell, MD  irbesartan (AVAPRO) 150 MG tablet Take 1 tablet (150 mg total) by mouth daily. Patient not taking: Reported on 08/07/2015 09/09/14   Belkys A Regalado, MD  simvastatin (ZOCOR) 20 MG tablet Take 1 tablet (20 mg total) by mouth daily. Patient not taking: Reported on 08/07/2015 09/09/14   Belkys A Regalado, MD   BP 146/64 mmHg  Pulse 102  Temp(Src) 98.1 F (36.7 C) (Oral)  Resp 20  SpO2 97% Physical Exam  Constitutional: He is oriented to person, place, and time. Vital signs are normal. He appears well-developed and well-nourished.  Non-toxic appearance. He does not appear ill. No distress.  HENT:  Head: Normocephalic and atraumatic.  Nose: Nose normal.  Mouth/Throat: Oropharynx is clear and moist. No oropharyngeal exudate.  Eyes: Conjunctivae and EOM are normal. Pupils are equal, round, and reactive to light. No scleral icterus.  Neck: Normal range of motion. Neck supple. No tracheal  deviation, no edema, no erythema and normal range of motion present. No thyroid mass and no thyromegaly present.  Cardiovascular: Normal rate, regular rhythm, S1 normal, S2 normal, normal heart sounds, intact distal pulses and normal pulses.  Exam reveals no gallop and no friction rub.   No murmur heard. Pulses:      Radial pulses are 2+ on the right side, and 2+ on the left side.       Dorsalis pedis pulses are 2+ on the right side, and 2+ on the left side.  Pulmonary/Chest: Effort normal and breath sounds normal. No respiratory distress. He has no wheezes. He has no rhonchi. He has no rales.  Abdominal: Soft. Normal appearance and bowel sounds are normal.  He exhibits no distension, no ascites and no mass. There is no hepatosplenomegaly. There is no tenderness. There is no rebound, no guarding and no CVA tenderness.  Musculoskeletal: Normal range of motion. He exhibits no edema or tenderness.  Severe tenderness to light touch of his left foot.  Lymphadenopathy:    He has no cervical adenopathy.  Neurological: He is alert and oriented to person, place, and time. He has normal strength. No cranial nerve deficit or sensory deficit.  Skin: Skin is warm, dry and intact. No petechiae and no rash noted. He is not diaphoretic. No erythema. No pallor.  Psychiatric: He has a normal mood and affect. His behavior is normal. Judgment normal.  Nursing note and vitals reviewed.   ED Course  Procedures (including critical care time) Labs Review Labs Reviewed - No data to display  Imaging Review No results found. I have personally reviewed and evaluated these images and lab results as part of my medical decision-making.   EKG Interpretation None      MDM   Final diagnoses:  None   Patient presents to the ED for foot pain.  His physical exam is normal other than severe tenderness to light touch.  This suggests nerve pain, however his distal toes are not affected.  His feet are warm and well perfused.  Pulses are normal, there is no swelling or signs of infection.  There is no warmth in any of his joints. I am not entirely sure what is causing his pain, but I do not believe this is a life threatening process.  His tachycardia resolved on my assessment prior to pain medication being ordered.  He was given norcox2 and toradol IM for pain control.  Will DC home with tramadol for breakthrough pain and I encouraged PCP follow up for further diagnostics.  He appears well and in NAD, pain has subsided, VS remain within his normal limits and he is safe for DC.   Everlene Balls, MD 08/07/15 [phone]

## 2018-12-30 ENCOUNTER — Ambulatory Visit (INDEPENDENT_AMBULATORY_CARE_PROVIDER_SITE_OTHER): Payer: No Typology Code available for payment source | Admitting: Cardiology

## 2018-12-30 ENCOUNTER — Encounter: Payer: Self-pay | Admitting: Cardiology

## 2018-12-30 VITALS — BP 155/79 | HR 84 | Ht 66.0 in | Wt 181.0 lb

## 2018-12-30 DIAGNOSIS — R600 Localized edema: Secondary | ICD-10-CM

## 2018-12-30 DIAGNOSIS — R0602 Shortness of breath: Secondary | ICD-10-CM | POA: Diagnosis not present

## 2018-12-30 DIAGNOSIS — I10 Essential (primary) hypertension: Secondary | ICD-10-CM

## 2018-12-30 DIAGNOSIS — R0989 Other specified symptoms and signs involving the circulatory and respiratory systems: Secondary | ICD-10-CM

## 2018-12-30 DIAGNOSIS — I351 Nonrheumatic aortic (valve) insufficiency: Secondary | ICD-10-CM

## 2018-12-30 DIAGNOSIS — R0600 Dyspnea, unspecified: Secondary | ICD-10-CM

## 2018-12-30 HISTORY — DX: Dyspnea, unspecified: R06.00

## 2018-12-30 NOTE — Progress Notes (Signed)
Subjective:  Primary Physician:  Reubin Milan, MD  Patient ID: Robert Boyer, male    DOB: [birth year], 73 y.o.   MRN: [id number]  Chief Complaint  Patient presents with  . Shortness of Breath    NP Eval    HPI: Robert Boyer  is a 73 y.o. Caucasian male here to establish cardiac care. He is referred by [facility] physician Dr. Reubin Milan.   Patient has complex medical history. I do not have all the records available to me today. Here is what I gathered from the patient and chart review through Care everywhere.   Patient has a history of chronic ischemic colitis with abdominal pain, lower extremity claudication in 2009. 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  He underwent extensive bypass surgery of his aortoiliac arteries.  I do not have the operative note. All of this was performed at [facility].  He is unsure if he has had a cardiologist or not, but there is a mention of EF 20% on hospital admission to [facility] in 2015. He has been told in the past that he had congestive hart failure due to agent Orange exposure during [place] war. PCP notes mentioned EF 50-55%, although I do not not know when this was performed. He denies having undergone cardiac catheterization or CABG/stents.    He also tells me that he has a "spot" in the upper part of his right lung, for which he is seeing Dr. Peyton Najjar. He is going to have a PET scan  Regarding more recent history, patient lives with his family in a 2 level house. He takes stairs several times a day with mild shortness of breath. He is trying to do more yardwork. He denies chest pain, claudications, TIA?stroke symptoms.  He endorses stable leg swelling. He is a former smoker ans uses bronchodilators.   Blood pressure is elevated today. He  states that BP is much lower at home.   Past Medical History:  Diagnosis Date  . CHF (congestive heart failure)   . COPD (chronic obstructive pulmonary disease)   . Emphysema/COPD   . Hypertension     Past Surgical History:  Procedure Laterality Date  . ABDOMINAL SURGERY    . ARTERIAL BYPASS SURGRY      Social History   Socioeconomic History  . Marital status: Married    Spouse name: Not on file  . Number of children: Not on file  . Years of education: Not on file  . Highest education level: Not on file  Occupational History  . Not on file  Social Needs  . Financial resource strain: Not on file  . Food insecurity:    Worry: Not on file    Inability: Not on file  . Transportation needs:    Medical: Not on file    Non-medical: Not on file  Tobacco Use  . Smoking status: Former Research scientist (life sciences)  . Smokeless tobacco: Never Used  Substance and Sexual Activity  . Alcohol use: No  . Drug use: No  . Sexual activity: Not Currently  Lifestyle  . Physical activity:    Days per week: Not on file    Minutes per session: Not on file  . Stress: Not on file  Relationships  . Social connections:    Talks on phone: Not  on file    Gets together: Not on file    Attends religious service: Not on file    Active member of club or organization: Not on file    Attends meetings of clubs or organizations: Not on file    Relationship status: Not on file  . Intimate partner violence:    Fear of current or ex partner: Not on file    Emotionally abused: Not on file    Physically abused: Not on file    Forced sexual activity: Not on file  Other Topics Concern  . Not on file  Social History Narrative  . Not on file    Current Outpatient Medications on File Prior to Visit  Medication Sig Dispense Refill  . aspirin 325 MG tablet Take 325 mg by mouth daily.    . calcium carbonate (TUMS - DOSED IN MG ELEMENTAL CALCIUM) 500 MG chewable tablet Chew 1 tablet by mouth daily.    . carvedilol (COREG)  25 MG tablet Take 0.5 tablets (12.5 mg total) by mouth 2 (two) times daily with a meal. 60 tablet 0  . furosemide (LASIX) 20 MG tablet Take 1 tablet (20 mg total) by mouth daily. 30 tablet 0  . irbesartan (AVAPRO) 150 MG tablet Take 1 tablet (150 mg total) by mouth daily. (Patient not taking: Reported on 08/07/2015) 30 tablet 0  . ranitidine (ZANTAC) 150 MG tablet Take 150 mg by mouth 2 (two) times daily.    . traMADol (ULTRAM) 50 MG tablet Take 1 tablet (50 mg total) by mouth every 12 (twelve) hours as needed for severe pain. 10 tablet 0  . triamcinolone cream (KENALOG) 0.1 % Apply 1 application topically 2 (two) times daily as needed (for ezcema).    . [DISCONTINUED] simvastatin (ZOCOR) 20 MG tablet Take 1 tablet (20 mg total) by mouth daily. (Patient not taking: Reported on 08/07/2015) 30 tablet 0   No current facility-administered medications on file prior to visit.      Review of Systems  Constitutional: Negative.   HENT: Negative for nosebleeds.   Eyes: Negative for blurred vision.  Respiratory: Positive for shortness of breath (Exertional, stable).   Cardiovascular: Positive for leg swelling. Negative for chest pain, palpitations, orthopnea and claudication.  Gastrointestinal: Negative for abdominal pain, nausea and vomiting.  Genitourinary: Negative for dysuria.  Musculoskeletal: Negative for myalgias.  Skin: Negative for itching and rash.  Neurological: Negative for dizziness and loss of consciousness.  Endo/Heme/Allergies: Does not bruise/bleed easily.  Psychiatric/Behavioral: The patient is not nervous/anxious.   All other systems reviewed and are negative.      Objective:  Blood pressure (!) 155/79, pulse 84, height 5' 6"  (1.676 m), weight 181 lb (82.1 kg), SpO2 93 %.   Physical Exam  Constitutional: He is oriented to person, place, and time. He appears well-developed and well-nourished. No distress.  HENT:  Head: Normocephalic and atraumatic.  Eyes: Pupils are equal,  round, and reactive to light. Conjunctivae are normal.  Neck: No JVD present.  Cardiovascular: Normal rate, regular rhythm and intact distal pulses.  Murmur (II/VI ejection systolic murmur RUSB) heard. Pulses:      Carotid pulses are on the right side with bruit and on the left side with bruit. Pulmonary/Chest: Effort normal and breath sounds normal. He has no wheezes. He has no rales.  Abdominal: Soft. Bowel sounds are normal. There is no rebound.  Midline scar  Musculoskeletal:        General: Edema (1+ b/l) present.  Lymphadenopathy:  He has no cervical adenopathy.  Neurological: He is alert and oriented to person, place, and time. No cranial nerve deficit.  Skin: Skin is warm and dry.  Psychiatric: He has a normal mood and affect.  Nursing note and vitals reviewed.    CARDIAC STUDIES:   EKG 12/30/2018: Sinus  Rhythm 74 bpm. Normal axis. Normal conduction.  Echocardiogram 2015:  - Mildly dilated LV with EF 20%. Diffuse hypokinesis. Normal RV size with mildly decreased systolic function. No significant valvular abnormalities. Mild pulmonary hypertension.  Labs 09/01/2018: Glucose 116. Cr 1.5 eGFR ?Marland Kitchen Na/K 138/4.1 AST/ALT normal. ANA 1:160 Chol 94, TG 269, HDL 24, LDL 12. HbA1C 6.1%.   Assessment & Recommendations:   73 year old Caucasian male, former smoker, peripheral vascular disease status post abdominal bypass surgery 2009 (details not known), history of heart failure with reduced ejection fraction, ongoing workup for possible lung nodule/mass  1. Shortness of breath He has h/o HFrEF in the past, although PCP note mentions normal EF. Clinically, he appears mildly volume overloaded. I would like to obtain more updated information about his baseline before making any changes to his medications. Will obtain echocardiogram.  2. Essential hypertension Suboptimal control. Reportedly lower at home. Continue current antihypertensive therapy. Will recheck at next  visit.   3. Bilateral carotid bruit: Will obtain carotid duplex. Continue aspirin/statin for now.    12/30/2018, 12:38 PM [facility] [place] Pager: [phone] Office: [phone] If no answer Cell [phone]

## 2018-12-31 ENCOUNTER — Encounter: Payer: Self-pay | Admitting: Cardiology

## 2018-12-31 ENCOUNTER — Ambulatory Visit: Payer: Self-pay | Admitting: Cardiology

## 2018-12-31 DIAGNOSIS — R600 Localized edema: Secondary | ICD-10-CM | POA: Insufficient documentation

## 2018-12-31 DIAGNOSIS — R0989 Other specified symptoms and signs involving the circulatory and respiratory systems: Secondary | ICD-10-CM | POA: Insufficient documentation

## 2018-12-31 DIAGNOSIS — I351 Nonrheumatic aortic (valve) insufficiency: Secondary | ICD-10-CM | POA: Insufficient documentation

## 2019-01-10 ENCOUNTER — Other Ambulatory Visit: Payer: Self-pay | Admitting: Internal Medicine

## 2019-01-10 ENCOUNTER — Other Ambulatory Visit (HOSPITAL_COMMUNITY): Payer: Self-pay | Admitting: Internal Medicine

## 2019-01-10 DIAGNOSIS — R911 Solitary pulmonary nodule: Secondary | ICD-10-CM

## 2019-01-11 ENCOUNTER — Telehealth (HOSPITAL_COMMUNITY): Payer: Self-pay

## 2019-01-11 ENCOUNTER — Ambulatory Visit
Admission: RE | Admit: 2019-01-11 | Discharge: 2019-01-11 | Disposition: A | Discharge status: Home / Self Care | Payer: Self-pay | Source: Ambulatory Visit | Attending: Radiation Oncology | Admitting: Radiation Oncology

## 2019-01-11 ENCOUNTER — Other Ambulatory Visit: Payer: Self-pay | Admitting: Radiation Oncology

## 2019-01-11 DIAGNOSIS — C3490 Malignant neoplasm of unspecified part of unspecified bronchus or lung: Secondary | ICD-10-CM

## 2019-01-11 NOTE — Progress Notes (Signed)
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  He states he had some cough.  PET 01/20/2019  CT Chest 10/2018: Irregularly shaped 11 x 10 mm posterior medial right upper lobe nodule that was new on 10/19/2018 is not significantly changed.  Other tiny calcified and noncalcified pulmonary nodules remain unchanged.  Biopsies of   Tobacco/Marijuana/Snuff/ETOH use: Former smoker, quit in 2009.  Past/Anticipated interventions by cardiothoracic surgery, if any:  -Not a surgical candidate due to inadequate residual lung function.   Past/Anticipated interventions by medical oncology, if any:  Dr. Stana Bunting 01/06/2019 -Not a surgical candidate due to inadequate residual lung function estimate per thoracic surgery. -Recommended PET/CT scan to rule out metastatic disease, IF no distant disease found, radiation oncology consultation for consideration of SBRT..    Signs/Symptoms  Weight changes, if any: No, pretty steady weight, minimal fluctuations.  Respiratory complaints, if any: No real SOB noted, no chest pain.  Hemoptysis, if any: Minimal non productive cough  Pain issues, if any:  No  BP (!) 154/76 (BP Location: Left Arm, Patient Position: Sitting)   Pulse 78   Temp 97.6 F (36.4 C) (Oral)   Resp 18   Ht 5\' 6"  (1.676 m)   Wt 181 lb (82.1 kg)   SpO2 96%   BMI 29.21 kg/m    Wt Readings from Last 3 Encounters:  01/13/19 181 lb (82.1 kg)  12/30/18 181 lb (82.1 kg)  09/09/14 168 lb 11.2 oz (76.5 kg)   SAFETY ISSUES:  Prior radiation? No  Pacemaker/ICD? No  Possible current pregnancy? No  Is the patient on methotrexate?  No  Current Complaints / other details:

## 2019-01-12 ENCOUNTER — Telehealth: Payer: Self-pay | Admitting: *Deleted

## 2019-01-12 NOTE — Telephone Encounter (Signed)
"  Caryl Pina [place] [phone]).  Calling to make sure you have what you ned for tomorrow's visit.   I have a disc with records from the [facility]."

## 2019-01-12 NOTE — Progress Notes (Addendum)
Radiation Oncology         ([phone]) [phone] ________________________________  Name: [name]        MRN: [id number]  Date of Service: 01/13/2019 DOB: 1946-05-12  IH:[id number], [name], MD  [name], MD     REFERRING PHYSICIAN: [name], MD   DIAGNOSIS: The primary encounter diagnosis was Malignant neoplasm of right upper lobe of lung ([place]). A diagnosis of Right upper lobe pulmonary nodule was also pertinent to this visit.   HISTORY OF PRESENT ILLNESS: Robert Boyer is a 73 y.o. male seen at the request of Dr. Stana Bunting for a persistent lung nodule.  The patient has a history of smoking, but discontinued use in 2009.  He was seen after undergoing a low-dose screening CT scan in December 2019 for an 11 mm right upper lobe pulmonary nodule.  He apparently had also had an intermittent cough.  He had repeat on 12/21/2018 through the [place] system, and was noted to have persistence of this right upper lobe nodule again measuring 11 mm.  He does also have a history of enlarged mediastinal and hilar nodes in comparison dating back to 2010.  He met with Dr. Mee Hives in cardiothoracic surgery through the [facility] system, and was not felt to be a good candidate for proceeding with surgical resection or with biopsy given his comorbidities.  He comes today to discuss options of stereotactic body radiotherapy to treat what is felt to be putative disease.  He is scheduled to undergo a PET scan on 01/20/2019.    PREVIOUS RADIATION THERAPY: No   PAST MEDICAL HISTORY:  Past Medical History:  Diagnosis Date  . CHF (congestive heart failure) ([place])   . COPD (chronic obstructive pulmonary disease) ([place])   . Dyspnea 12/30/2018  . Emphysema/COPD ([place])   . Gout   . Hypertension   . Shortness of breath        PAST SURGICAL HISTORY: Past Surgical History:  Procedure Laterality Date  . ABDOMINAL SURGERY    . ARTERIAL BYPASS SURGRY       FAMILY HISTORY:  Family History  Problem Relation Age  of Onset  . Cancer Mother   . Arthritis Other   . Cancer Other   . Heart attack Father      SOCIAL HISTORY:  reports that he quit smoking about 10 years ago. His smoking use included cigarettes. He has a 64.00 pack-year smoking history. He has never used smokeless tobacco. He reports current alcohol use. He reports that he does not use drugs.   ALLERGIES: Simvastatin and Penicillins   MEDICATIONS:  Current Outpatient Medications  Medication Sig Dispense Refill  . acetaminophen (TYLENOL) 325 MG tablet Take 650 mg by mouth every 6 (six) hours as needed. 2 tablets as needed for pain.    Marland Kitchen allopurinol (ZYLOPRIM) 100 MG tablet Take 100 mg by mouth daily. Take 2 tablets by mouth in the morning    . ascorbic acid (VITAMIN C) 500 MG tablet Take 500 mg by mouth 2 (two) times daily. Take one tablet by mouth twice daily    . aspirin EC 81 MG tablet Take 81 mg by mouth daily.    . calcium carbonate (TUMS - DOSED IN MG ELEMENTAL CALCIUM) 500 MG chewable tablet Chew 1 tablet by mouth daily.    . carvedilol (COREG) 25 MG tablet Take 0.5 tablets (12.5 mg total) by mouth 2 (two) times daily with a meal. 60 tablet 0  . Cholecalciferol 50 MCG (2000 UT) TABS Take  2,000 Units by mouth daily. Take 1 tablet by mouth daily.    . furosemide (LASIX) 20 MG tablet Take 1 tablet (20 mg total) by mouth daily. 30 tablet 0  . ibuprofen (ADVIL,MOTRIN) 800 MG tablet Take 800 mg by mouth every 8 (eight) hours as needed. Take one tablet by mouth every 8 hours (take with food), 1 tablet by mouth every eight hours as needed for pain.    Marland Kitchen irbesartan (AVAPRO) 150 MG tablet Take 1 tablet (150 mg total) by mouth daily. (Patient taking differently: Take 75 mg by mouth 2 (two) times daily. ) 30 tablet 0  . methimazole (TAPAZOLE) 10 MG tablet Take 10 mg by mouth daily. Take one tablet by mouth daily.    . Oxcarbazepine (TRILEPTAL) 300 MG tablet Take 300 mg by mouth 2 (two) times daily. Take one half tablet by mouth once a day and  take one tablet at bedtime for nerve pain.    . ranitidine (ZANTAC) 150 MG tablet Take 150 mg by mouth as needed.     . simvastatin (ZOCOR) 20 MG tablet Take 20 mg by mouth daily. 1/2 tab once daily in the evening.    . triamcinolone cream (KENALOG) 0.1 % Apply 1 application topically 2 (two) times daily as needed (for ezcema).    . valsartan (DIOVAN) 80 MG tablet Take 80 mg by mouth 2 (two) times daily. Take one-half tablet by mouth twice daily.    Marland Kitchen tiotropium (SPIRIVA) 18 MCG inhalation capsule Place 1.25 mcg into inhaler and inhale daily. Inhale 2 puffs by mouth once a day.     No current facility-administered medications for this encounter.      REVIEW OF SYSTEMS: On review of systems, the patient reports that he is doing well overall.  While he reports intermittent cough, he denies any productive cough or hemoptysis.  He denies any chest pain, shortness of breath, fevers, chills, night sweats, unintended weight changes. He denies any bowel or bladder disturbances, and denies abdominal pain, nausea or vomiting. He denies any new musculoskeletal or joint aches or pains. A complete review of systems is obtained and is otherwise negative.     PHYSICAL EXAM:  Wt Readings from Last 3 Encounters:  01/13/19 181 lb (82.1 kg)  12/30/18 181 lb (82.1 kg)  09/09/14 168 lb 11.2 oz (76.5 kg)   Temp Readings from Last 3 Encounters:  01/13/19 97.6 F (36.4 C) (Oral)  08/07/15 98.1 F (36.7 C) (Oral)  09/09/14 97.4 F (36.3 C) (Oral)   BP Readings from Last 3 Encounters:  01/13/19 (!) 154/76  12/30/18 (!) 155/79  08/07/15 146/64   Pulse Readings from Last 3 Encounters:  01/13/19 78  12/30/18 84  08/07/15 102   Pain Assessment Pain Score: 0-No pain/10  In general this is a well appearing Caucasian male in no acute distress.  He is alert and oriented x4 and appropriate throughout the examination. HEENT reveals that the patient is normocephalic, atraumatic. EOMs are intact. Skin is intact  without any evidence of gross lesions. Cardiovascular exam reveals a regular rate and rhythm, no clicks rubs or murmurs are auscultated. Chest is clear to auscultation bilaterally. Lymphatic assessment is performed and does not reveal any adenopathy in the cervical or supraclavicular chains. Abdomen has active bowel sounds in all quadrants and is intact. The abdomen is soft, non tender, non distended. Lower extremities are negative for pretibial pitting edema, deep calf tenderness, cyanosis or clubbing.   ECOG = 0  0 -  Asymptomatic (Fully active, able to carry on all predisease activities without restriction)  1 - Symptomatic but completely ambulatory (Restricted in physically strenuous activity but ambulatory and able to carry out work of a light or sedentary nature. For example, light housework, office work)  2 - Symptomatic, <50% in bed during the day (Ambulatory and capable of all self care but unable to carry out any work activities. Up and about more than 50% of waking hours)  3 - Symptomatic, >50% in bed, but not bedbound (Capable of only limited self-care, confined to bed or chair 50% or more of waking hours)  4 - Bedbound (Completely disabled. Cannot carry on any self-care. Totally confined to bed or chair)  5 - Death   Eustace Pen MM, Creech RH, Tormey DC, et al. [phone]). "Toxicity and response criteria of the [facility]". [place] Oncol. 5 (6): 649-55    LABORATORY DATA:  Lab Results  Component Value Date   WBC 10.9 (H) 09/07/2014   HGB 12.9 (L) 09/07/2014   HCT 38.5 (L) 09/07/2014   MCV 93.7 09/07/2014   PLT 347 09/07/2014   Lab Results  Component Value Date   NA 138 09/09/2014   K 4.4 09/09/2014   CL 102 09/09/2014   CO2 23 09/09/2014   Lab Results  Component Value Date   ALT 18 09/07/2014   AST 20 09/07/2014   ALKPHOS 62 09/07/2014   BILITOT 0.7 09/07/2014      RADIOGRAPHY: No results found.     IMPRESSION/PLAN: 1. Persistent right  upper lobe nodule.  Dr. Lisbeth Renshaw discusses the findings from his recent imaging, his CT scans were reviewed, and Dr. Lisbeth Renshaw recommends proceeding with pet imaging to further understand and document any changes in the lesion in the right upper lobe as well as whether if there is any hypermetabolic feature.  If there is hypermetabolism, we would favor treatment early with stereotactic radiotherapy.  If however this is inconclusive, we discussed the option to repeat imaging in approximately 3 months with additional CT.  Patient is in agreement with this plan.  We did discuss the risks, benefits, short and long-term effects of radiotherapy, and outlined the delivery and logistics of stereotactic procedures.  If we were to proceed Dr. Lisbeth Renshaw would anticipate a course of 3-5 fractions.  The patient is in agreement with this plan.  We will follow-up with the results of his PET when available, and make recommendations accordingly.  In a visit lasting 60 minutes, greater than 50% of the time was spent face to face discussing his case, and coordinating the patient's care.   The above documentation reflects my direct findings during this shared patient visit. Please see the separate note by Dr. Lisbeth Renshaw on this date for the remainder of the patient's plan of care.    Carola Rhine, PAC

## 2019-01-13 ENCOUNTER — Encounter: Payer: Self-pay | Admitting: Radiation Oncology

## 2019-01-13 ENCOUNTER — Other Ambulatory Visit: Payer: Self-pay

## 2019-01-13 ENCOUNTER — Ambulatory Visit
Admission: RE | Admit: 2019-01-13 | Discharge: 2019-01-13 | Disposition: A | Discharge status: Home / Self Care | Payer: No Typology Code available for payment source | Source: Ambulatory Visit | Attending: Radiation Oncology | Admitting: Radiation Oncology

## 2019-01-13 VITALS — BP 154/76 | HR 78 | Temp 97.6°F | Resp 18 | Ht 66.0 in | Wt 181.0 lb

## 2019-01-13 DIAGNOSIS — Z7982 Long term (current) use of aspirin: Secondary | ICD-10-CM | POA: Insufficient documentation

## 2019-01-13 DIAGNOSIS — Z79899 Other long term (current) drug therapy: Secondary | ICD-10-CM | POA: Insufficient documentation

## 2019-01-13 DIAGNOSIS — C3411 Malignant neoplasm of upper lobe, right bronchus or lung: Secondary | ICD-10-CM | POA: Diagnosis present

## 2019-01-13 DIAGNOSIS — M109 Gout, unspecified: Secondary | ICD-10-CM | POA: Insufficient documentation

## 2019-01-13 DIAGNOSIS — I509 Heart failure, unspecified: Secondary | ICD-10-CM | POA: Diagnosis not present

## 2019-01-13 DIAGNOSIS — Z87891 Personal history of nicotine dependence: Secondary | ICD-10-CM | POA: Diagnosis not present

## 2019-01-13 DIAGNOSIS — J449 Chronic obstructive pulmonary disease, unspecified: Secondary | ICD-10-CM | POA: Diagnosis not present

## 2019-01-13 DIAGNOSIS — I110 Hypertensive heart disease with heart failure: Secondary | ICD-10-CM | POA: Insufficient documentation

## 2019-01-13 DIAGNOSIS — R911 Solitary pulmonary nodule: Secondary | ICD-10-CM

## 2019-01-16 DIAGNOSIS — R911 Solitary pulmonary nodule: Secondary | ICD-10-CM | POA: Insufficient documentation

## 2019-01-19 ENCOUNTER — Ambulatory Visit: Payer: Non-veteran care

## 2019-01-19 ENCOUNTER — Ambulatory Visit: Payer: No Typology Code available for payment source

## 2019-01-19 ENCOUNTER — Other Ambulatory Visit: Payer: Self-pay

## 2019-01-19 DIAGNOSIS — R600 Localized edema: Secondary | ICD-10-CM | POA: Diagnosis not present

## 2019-01-19 DIAGNOSIS — R0689 Other abnormalities of breathing: Secondary | ICD-10-CM

## 2019-01-19 DIAGNOSIS — R0989 Other specified symptoms and signs involving the circulatory and respiratory systems: Secondary | ICD-10-CM | POA: Diagnosis not present

## 2019-01-19 DIAGNOSIS — I351 Nonrheumatic aortic (valve) insufficiency: Secondary | ICD-10-CM

## 2019-01-20 ENCOUNTER — Encounter (HOSPITAL_COMMUNITY)
Admission: RE | Admit: 2019-01-20 | Discharge: 2019-01-20 | Disposition: A | Discharge status: Home / Self Care | Payer: No Typology Code available for payment source | Source: Ambulatory Visit | Attending: Internal Medicine | Admitting: Internal Medicine

## 2019-01-20 DIAGNOSIS — R911 Solitary pulmonary nodule: Secondary | ICD-10-CM | POA: Insufficient documentation

## 2019-01-20 LAB — GLUCOSE, CAPILLARY: Glucose-Capillary: 117 mg/dL — ABNORMAL HIGH (ref 70–99)

## 2019-01-20 MED ORDER — FLUDEOXYGLUCOSE F - 18 (FDG) INJECTION
9.5000 | Freq: Once | INTRAVENOUS | Status: AC | PRN
  Administered 2019-01-20: 9.5 via INTRAVENOUS

## 2019-01-23 ENCOUNTER — Other Ambulatory Visit: Payer: Self-pay | Admitting: Cardiology

## 2019-01-23 DIAGNOSIS — R0989 Other specified symptoms and signs involving the circulatory and respiratory systems: Secondary | ICD-10-CM

## 2019-01-23 DIAGNOSIS — I6522 Occlusion and stenosis of left carotid artery: Secondary | ICD-10-CM

## 2019-01-24 ENCOUNTER — Telehealth: Payer: Self-pay | Admitting: Radiation Oncology

## 2019-01-24 DIAGNOSIS — R911 Solitary pulmonary nodule: Secondary | ICD-10-CM

## 2019-01-24 NOTE — Progress Notes (Deleted)
Subjective:  Primary Physician:  Reubin Milan, MD  Patient ID: Robert Boyer, male    DOB: [birth year], 73 y.o.   MRN: [id number]  No chief complaint on file.   HPI: Rehan Holness  is a 73 y.o. Caucasian male here to establish cardiac care. He is referred by [facility] physician Dr. Reubin Milan.   73 year old Caucasian male, former smoker, peripheral vascular disease status post abdominal bypass surgery 2009 (details not known), history of heart failure with reduced ejection fraction, ongoing workup for possible lung nodule/mass  Echocardiogram showed mildly reduced EF at 45-50%, mild AS.  ***  Past Medical History:  Diagnosis Date  . CHF (congestive heart failure) ([place])   . COPD (chronic obstructive pulmonary disease) ([place])   . Dyspnea 12/30/2018  . Emphysema/COPD ([place])   . Gout   . Hypertension   . Shortness of breath     Past Surgical History:  Procedure Laterality Date  . ABDOMINAL SURGERY    . ARTERIAL BYPASS SURGRY      Social History   Socioeconomic History  . Marital status: Married    Spouse name: Not on file  . Number of children: 4  . Years of education: Not on file  . Highest education level: Not on file  Occupational History  . Not on file  Social Needs  . Financial resource strain: Not on file  . Food insecurity:    Worry: Not on file    Inability: Not on file  . Transportation needs:    Medical: No    Non-medical: No  Tobacco Use  . Smoking status: Former Smoker    Packs/day: 2.00    Years: 32.00    Pack years: 64.00    Types: Cigarettes    Last attempt to quit: 02/04/2008    Years since quitting: 10.9  . Smokeless tobacco: Never Used  Substance and Sexual Activity  . Alcohol use: Yes    Comment: occasionally  . Drug use: No  . Sexual activity: Not Currently  Lifestyle  . Physical activity:    Days per week: Not on file    Minutes per session: Not on file  . Stress: Not on file  Relationships  . Social connections:    Talks  on phone: Not on file    Gets together: Not on file    Attends religious service: Not on file    Active member of club or organization: Not on file    Attends meetings of clubs or organizations: Not on file    Relationship status: Not on file  . Intimate partner violence:    Fear of current or ex partner: Not on file    Emotionally abused: Not on file    Physically abused: Not on file    Forced sexual activity: Not on file  Other Topics Concern  . Not on file  Social History Narrative  . Not on file    Current Outpatient Medications on File Prior to Visit  Medication Sig Dispense Refill  . acetaminophen (TYLENOL) 325 MG tablet Take 650 mg by mouth every 6 (six) hours as needed. 2 tablets as needed for pain.    Marland Kitchen allopurinol (ZYLOPRIM) 100 MG tablet Take 100 mg by mouth daily. Take 2 tablets by mouth in the morning    . ascorbic acid (VITAMIN C) 500 MG tablet Take 500 mg by mouth 2 (two) times daily. Take one tablet by mouth twice daily    . aspirin EC 81 MG tablet  Take 81 mg by mouth daily.    . calcium carbonate (TUMS - DOSED IN MG ELEMENTAL CALCIUM) 500 MG chewable tablet Chew 1 tablet by mouth daily.    . carvedilol (COREG) 25 MG tablet Take 0.5 tablets (12.5 mg total) by mouth 2 (two) times daily with a meal. 60 tablet 0  . Cholecalciferol 50 MCG (2000 UT) TABS Take 2,000 Units by mouth daily. Take 1 tablet by mouth daily.    . furosemide (LASIX) 20 MG tablet Take 1 tablet (20 mg total) by mouth daily. 30 tablet 0  . ibuprofen (ADVIL,MOTRIN) 800 MG tablet Take 800 mg by mouth every 8 (eight) hours as needed. Take one tablet by mouth every 8 hours (take with food), 1 tablet by mouth every eight hours as needed for pain.    Marland Kitchen irbesartan (AVAPRO) 150 MG tablet Take 1 tablet (150 mg total) by mouth daily. (Patient taking differently: Take 75 mg by mouth 2 (two) times daily. ) 30 tablet 0  . methimazole (TAPAZOLE) 10 MG tablet Take 10 mg by mouth daily. Take one tablet by mouth daily.     . Oxcarbazepine (TRILEPTAL) 300 MG tablet Take 300 mg by mouth 2 (two) times daily. Take one half tablet by mouth once a day and take one tablet at bedtime for nerve pain.    . ranitidine (ZANTAC) 150 MG tablet Take 150 mg by mouth as needed.     . simvastatin (ZOCOR) 20 MG tablet Take 20 mg by mouth daily. 1/2 tab once daily in the evening.    . tiotropium (SPIRIVA) 18 MCG inhalation capsule Place 1.25 mcg into inhaler and inhale daily. Inhale 2 puffs by mouth once a day.    . triamcinolone cream (KENALOG) 0.1 % Apply 1 application topically 2 (two) times daily as needed (for ezcema).    . valsartan (DIOVAN) 80 MG tablet Take 80 mg by mouth 2 (two) times daily. Take one-half tablet by mouth twice daily.     No current facility-administered medications on file prior to visit.      Review of Systems  Constitutional: Negative.   HENT: Negative for nosebleeds.   Eyes: Negative for blurred vision.  Respiratory: Positive for shortness of breath (Exertional, stable).   Cardiovascular: Positive for leg swelling. Negative for chest pain, palpitations, orthopnea and claudication.  Gastrointestinal: Negative for abdominal pain, nausea and vomiting.  Genitourinary: Negative for dysuria.  Musculoskeletal: Negative for myalgias.  Skin: Negative for itching and rash.  Neurological: Negative for dizziness and loss of consciousness.  Endo/Heme/Allergies: Does not bruise/bleed easily.  Psychiatric/Behavioral: The patient is not nervous/anxious.   All other systems reviewed and are negative.     *** Objective:  There were no vitals taken for this visit.   Physical Exam  Constitutional: He is oriented to person, place, and time. He appears well-developed and well-nourished. No distress.  HENT:  Head: Normocephalic and atraumatic.  Eyes: Pupils are equal, round, and reactive to light. Conjunctivae are normal.  Neck: No JVD present.  Cardiovascular: Normal rate, regular rhythm and intact distal  pulses.  Murmur (II/VI ejection systolic murmur RUSB) heard. Pulses:      Carotid pulses are on the right side with bruit and on the left side with bruit. Pulmonary/Chest: Effort normal and breath sounds normal. He has no wheezes. He has no rales.  Abdominal: Soft. Bowel sounds are normal. There is no rebound.  Midline scar  Musculoskeletal:        General: Edema (1+ b/l)  present.  Lymphadenopathy:    He has no cervical adenopathy.  Neurological: He is alert and oriented to person, place, and time. No cranial nerve deficit.  Skin: Skin is warm and dry.  Psychiatric: He has a normal mood and affect.  Nursing note and vitals reviewed.    CARDIAC STUDIES:   Echocardiogram 01/19/2019: Left ventricle cavity is normal in size. Moderate concentric hypertrophy of the left ventricle. Mild decrease in global wall motion. Doppler evidence of grade I (impaired) diastolic dysfunction, normal LAP. Calculated EF 46%. Mild aortic valve stenosis. Aortic valve mean gradient of 9 mmHg, Vmax of 2.0 m/s. Calculated aortic valve area by continuity equation is 1.3 cm.  Mild tricuspid regurgitation.  No evidence of pulmonary hypertension.  Carotid artery duplex  01/19/2019 Mild diffuse plaque right common and internal carotid artery   Stenosis in the left internal carotid artery (50-69%), lower end of spectrum. Mild stenosis in the left external carotid artery (<50%). Antegrade right vertebral artery flow. Antegrade left vertebral artery flow. Follow up in six months is appropriate if clinically indicated.   EKG 12/30/2018: Sinus  Rhythm 74 bpm. Normal axis. Normal conduction.  Echocardiogram 2015:  - Mildly dilated LV with EF 20%. Diffuse hypokinesis. Normal RV size with mildly decreased systolic function. No significant valvular abnormalities. Mild pulmonary hypertension.  Labs 09/01/2018: Glucose 116. Cr 1.5 eGFR ?Marland Kitchen Na/K 138/4.1 AST/ALT normal. ANA 1:160 Chol 94, TG 269, HDL 24, LDL  12. HbA1C 6.1%.   Assessment & Recommendations:   *** 73 year old Caucasian male, former smoker, peripheral vascular disease status post abdominal bypass surgery 2009 (details not known), history of heart failure with reduced ejection fraction, ongoing workup for possible lung nodule/mass  1. Shortness of breath He has h/o HFrEF in the past, although PCP note mentions normal EF. Clinically, he appears mildly volume overloaded. I would like to obtain more updated information about his baseline before making any changes to his medications. Will obtain echocardiogram.  2. Essential hypertension Suboptimal control. Reportedly lower at home. Continue current antihypertensive therapy. Will recheck at next visit.   3. Bilateral carotid bruit: Will obtain carotid duplex. Continue aspirin/statin for now.    01/24/2019, 7:31 AM [facility] [place] Pager: [phone] Office: [phone] If no answer Cell [phone]

## 2019-01-24 NOTE — Telephone Encounter (Signed)
I called to review the patient's PET Imaging results but will have to call back, VM is not set up

## 2019-01-24 NOTE — Progress Notes (Signed)
Pt aware of results and pending appt.//ah

## 2019-01-24 NOTE — Telephone Encounter (Signed)
I was able to speak with the patient and let him know his PET imaging results and plans to repeat a scan in 3 months as discussed previously with Dr. Lisbeth Renshaw. He is in agreement with this plan.

## 2019-02-02 ENCOUNTER — Ambulatory Visit: Payer: Non-veteran care | Admitting: Cardiology

## 2019-02-08 ENCOUNTER — Encounter: Payer: Self-pay | Admitting: Cardiology

## 2019-02-09 ENCOUNTER — Other Ambulatory Visit: Payer: Self-pay

## 2019-02-09 ENCOUNTER — Encounter: Payer: Self-pay | Admitting: Cardiology

## 2019-02-09 ENCOUNTER — Ambulatory Visit (INDEPENDENT_AMBULATORY_CARE_PROVIDER_SITE_OTHER): Payer: No Typology Code available for payment source | Admitting: Cardiology

## 2019-02-09 VITALS — BP 129/82 | HR 76 | Ht 66.0 in | Wt 181.4 lb

## 2019-02-09 DIAGNOSIS — I10 Essential (primary) hypertension: Secondary | ICD-10-CM

## 2019-02-09 DIAGNOSIS — I739 Peripheral vascular disease, unspecified: Secondary | ICD-10-CM

## 2019-02-09 DIAGNOSIS — I6523 Occlusion and stenosis of bilateral carotid arteries: Secondary | ICD-10-CM

## 2019-02-09 NOTE — Progress Notes (Signed)
Subjective:  Primary Physician:  Reubin Milan, MD  Patient ID: Robert Boyer, male    DOB: [birth year], 73 y.o.   MRN: [id number]  Chief Complaint  Patient presents with  . Congestive Heart Failure  . Follow-up   I connected with the patient on 02/08/2019 by a video enabled telemedicine application and verified that I am speaking with the correct person using two identifiers.     I discussed the limitations of evaluation and management by telemedicine and the availability of in person appointments. The patient expressed understanding and agreed to proceed.   This visit type was conducted due to national recommendations for restrictions regarding the COVID-19 Pandemic (e.g. social distancing).  This format is felt to be most appropriate for this patient at this time.  All issues noted in this document were discussed and addressed.  No physical exam was performed (except for noted visual exam findings with Tele health visits).  The patient has consented to conduct a Tele health visit and understands insurance will be billed.   HPI: Robert Boyer  is a 73 y.o. Caucasian male here to establish cardiac care. He is referred by [facility] physician Dr. Reubin Milan.   73 year old Caucasian male, former smoker, peripheral vascular disease status post abdominal bypass surgery 2009 (details not known), history of heart failure with reduced ejection fraction, ongoing workup for possible lung nodule/mass  Echocardiogram showed mildly reduced EF at 45-50%, mild AS. Patient has been doing well since his last visit. He denies chest pain, shortness of breath, palpitations, leg edema, orthopnea, PND, TIA/syncope. BP is better controlled.    Past Medical History:  Diagnosis Date  . CHF (congestive heart failure) ([place])   . COPD (chronic obstructive pulmonary disease) ([place])   . Dyspnea 12/30/2018  . Emphysema/COPD ([place])   . Gout   . Hypertension   . Shortness of breath     Past Surgical  History:  Procedure Laterality Date  . ABDOMINAL SURGERY    . ARTERIAL BYPASS SURGRY    . CARDIAC CATHETERIZATION  2009    Social History   Socioeconomic History  . Marital status: Married    Spouse name: Not on file  . Number of children: 4  . Years of education: Not on file  . Highest education level: Not on file  Occupational History  . Not on file  Social Needs  . Financial resource strain: Not on file  . Food insecurity:    Worry: Not on file    Inability: Not on file  . Transportation needs:    Medical: No    Non-medical: No  Tobacco Use  . Smoking status: Former Smoker    Packs/day: 2.00    Years: 32.00    Pack years: 64.00    Types: Cigarettes    Last attempt to quit: 02/04/2008    Years since quitting: 11.0  . Smokeless tobacco: Never Used  Substance and Sexual Activity  . Alcohol use: Yes    Comment: occasionally  . Drug use: No  . Sexual activity: Not Currently  Lifestyle  . Physical activity:    Days per week: Not on file    Minutes per session: Not on file  . Stress: Not on file  Relationships  . Social connections:    Talks on phone: Not on file    Gets together: Not on file    Attends religious service: Not on file    Active member of club or organization: Not on file  Attends meetings of clubs or organizations: Not on file    Relationship status: Not on file  . Intimate partner violence:    Fear of current or ex partner: Not on file    Emotionally abused: Not on file    Physically abused: Not on file    Forced sexual activity: Not on file  Other Topics Concern  . Not on file  Social History Narrative  . Not on file    Current Outpatient Medications on File Prior to Visit  Medication Sig Dispense Refill  . acetaminophen (TYLENOL) 325 MG tablet Take 650 mg by mouth every 6 (six) hours as needed. 2 tablets as needed for pain.    Marland Kitchen allopurinol (ZYLOPRIM) 100 MG tablet Take 100 mg by mouth daily. Take 2 tablets by mouth in the morning     . ascorbic acid (VITAMIN C) 500 MG tablet Take 500 mg by mouth 2 (two) times daily. Take one tablet by mouth twice daily    . aspirin EC 81 MG tablet Take 81 mg by mouth daily.    . calcium carbonate (TUMS - DOSED IN MG ELEMENTAL CALCIUM) 500 MG chewable tablet Chew 1 tablet by mouth as needed.     . carvedilol (COREG) 25 MG tablet Take 0.5 tablets (12.5 mg total) by mouth 2 (two) times daily with a meal. 60 tablet 0  . Cholecalciferol 50 MCG (2000 UT) TABS Take 2,000 Units by mouth every other day. Take 1 tablet by mouth daily.     . furosemide (LASIX) 20 MG tablet Take 1 tablet (20 mg total) by mouth daily. 30 tablet 0  . methimazole (TAPAZOLE) 10 MG tablet Take 10 mg by mouth daily. Take one tablet by mouth daily.    . Oxcarbazepine (TRILEPTAL) 300 MG tablet Take 300 mg by mouth 2 (two) times daily. Take one half tablet by mouth once a day and take one tablet at bedtime for nerve pain.    . ranitidine (ZANTAC) 150 MG tablet Take 150 mg by mouth as needed.     . simvastatin (ZOCOR) 40 MG tablet Take 20 mg by mouth daily.    Marland Kitchen tiotropium (SPIRIVA) 18 MCG inhalation capsule Place 1.25 mcg into inhaler and inhale as needed. Inhale 2 puffs by mouth once a day.     . triamcinolone cream (KENALOG) 0.1 % Apply 1 application topically 2 (two) times daily as needed (for ezcema).    . valsartan (DIOVAN) 80 MG tablet Take 80 mg by mouth 2 (two) times daily. Take one-half tablet by mouth twice daily.     No current facility-administered medications on file prior to visit.      Review of Systems  Constitutional: Negative.   HENT: Negative for nosebleeds.   Eyes: Negative for blurred vision.  Respiratory: Negative for shortness of breath (Exertional, stable).   Cardiovascular: Negative for chest pain, palpitations, orthopnea, claudication and leg swelling.  Gastrointestinal: Negative for abdominal pain, nausea and vomiting.  Genitourinary: Negative for dysuria.  Musculoskeletal: Negative for myalgias.   Skin: Negative for itching and rash.  Neurological: Negative for dizziness and loss of consciousness.  Endo/Heme/Allergies: Does not bruise/bleed easily.  Psychiatric/Behavioral: The patient is not nervous/anxious.   All other systems reviewed and are negative.      Objective:  Blood pressure 129/82, pulse 76, height 5' 6" (1.676 m), weight 181 lb 6.4 oz (82.3 kg).   Physical Exam  Constitutional: He is oriented to person, place, and time. He appears well-developed and well-nourished.  No distress.  HENT:  Head: Normocephalic and atraumatic.  Neck: No JVD present.  Cardiovascular:  Murmur: II/VI ejection systolic murmur RUSB. Pulses:      Carotid pulses are on the right side with bruit and on the left side with bruit. Pulmonary/Chest: Effort normal. He has no wheezes. He has no rales.  Abdominal: There is no rebound.  Midline scar  Musculoskeletal:        General: No edema (1+ b/l).  Neurological: He is alert and oriented to person, place, and time.  Psychiatric: He has a normal mood and affect.  Nursing note and vitals reviewed.    CARDIAC STUDIES:   Echocardiogram 01/19/2019: Left ventricle cavity is normal in size. Moderate concentric hypertrophy of the left ventricle. Mild decrease in global wall motion. Doppler evidence of grade I (impaired) diastolic dysfunction, normal LAP. Calculated EF 46%. Mild aortic valve stenosis. Aortic valve mean gradient of 9 mmHg, Vmax of 2.0 m/s. Calculated aortic valve area by continuity equation is 1.3 cm.  Mild tricuspid regurgitation.  No evidence of pulmonary hypertension.  Carotid artery duplex  01/19/2019 Mild diffuse plaque right common and internal carotid artery   Stenosis in the left internal carotid artery (50-69%), lower end of spectrum. Mild stenosis in the left external carotid artery (<50%). Antegrade right vertebral artery flow. Antegrade left vertebral artery flow. Follow up in six months is appropriate if clinically  indicated.   EKG 12/30/2018: Sinus  Rhythm 74 bpm. Normal axis. Normal conduction.  Echocardiogram 2015:  - Mildly dilated LV with EF 20%. Diffuse hypokinesis. Normal RV size with mildly decreased systolic function. No significant valvular abnormalities. Mild pulmonary hypertension.  Labs 09/01/2018: Glucose 116. Cr 1.5 eGFR ?Marland Kitchen Na/K 138/4.1 AST/ALT normal. ANA 1:160 Chol 94, TG 269, HDL 24, LDL 12. HbA1C 6.1%.   Assessment & Recommendations:    73 year old Caucasian male, former smoker, peripheral vascular disease status post abdominal bypass surgery 2009 (details not known), history of heart failure with reduced ejection fraction, ongoing workup for possible lung nodule/mass  1. Shortness of breath H/o HFrEF. Now resolved. EF improved to 45-50%. Continue current medical therapy.   2. Essential hypertension Improved. Continue current medical therapy.   3. Mild to moderate carotid artery stenosis: Continue medical management with aspirin and statin. He is on low intensity statin with very low LDL at last check in 08/2018. I think this is due to high TG. Will repeat lipid panel in 04/2019. Continue Simvastatin for now.   4. PAD: S/p abdominal bypass surgery at [facility] (details not known). No claudication at this time. Will try to obtain the details.   Will obtain carotid duplex. Continue aspirin/statin for now.    02/09/2019, 10:06 AM [facility]. [place] Pager: [phone] Office: [phone] If no answer Cell [phone]

## 2019-02-10 ENCOUNTER — Ambulatory Visit: Payer: Non-veteran care | Admitting: Cardiology

## 2019-04-27 ENCOUNTER — Ambulatory Visit: Payer: Non-veteran care

## 2019-04-27 ENCOUNTER — Ambulatory Visit: Payer: Non-veteran care | Admitting: Radiation Oncology

## 2019-04-28 ENCOUNTER — Ambulatory Visit: Payer: No Typology Code available for payment source

## 2019-04-28 ENCOUNTER — Ambulatory Visit (HOSPITAL_COMMUNITY): Payer: Non-veteran care

## 2019-05-04 ENCOUNTER — Telehealth: Payer: Self-pay | Admitting: *Deleted

## 2019-05-04 NOTE — Telephone Encounter (Signed)
CALLED PATIENT TO INFORM OF LAB AND CT FOR 05-09-19, LAB (1:45 PM @ [place] AND CT ARRIVAL TIME- 2:45 PM @ [facility], PT. TO HAVE WATER ONLY - 4 HRS. PRIOR TO TEST), A. PERKINS TO CALL WITH RESULTS ON 05-10-19 @ 2 PM, VM FULL AND UNABLE TO LEAVE MESSAGE WILL CALL LATER.

## 2019-05-04 NOTE — Telephone Encounter (Signed)
CALLED PATIENT TO INFORM OF LAB ON 05-09-19 @ 1:45 PM @ [place] AND HIS CT ON 05-09-19 - ARRIVAL TIME - 2:45 PM @ [facility], PT. TO HAVE WATER ONLY - 4 HRS. PRIOR TO TEST, AND PROVIDER WILL FOLLOW-UP WITH RESULTS ON 05-10-19, SPOKE WITH PATIENT AND HE VERIFIED UNDERSTANDING THIS

## 2019-05-09 ENCOUNTER — Ambulatory Visit (HOSPITAL_COMMUNITY): Admission: RE | Admit: 2019-05-09 | Payer: Non-veteran care | Source: Ambulatory Visit

## 2019-05-09 ENCOUNTER — Ambulatory Visit: Payer: Non-veteran care | Attending: Radiation Oncology

## 2019-05-10 ENCOUNTER — Inpatient Hospital Stay: Admission: RE | Admit: 2019-05-10 | Payer: Self-pay | Source: Ambulatory Visit | Admitting: Radiation Oncology

## 2019-05-10 ENCOUNTER — Telehealth: Payer: Self-pay | Admitting: *Deleted

## 2019-05-10 NOTE — Telephone Encounter (Signed)
Called patient to inform that Shona Simpson would not be calling him today, due to failing to have labs and scan, patient told me that he had his labs and scan on 05-04-19 @ [place], called [facility] and asked them to fax the reports, told patient that Shona Simpson would call with results after she receives the faxed reports, patient verified understanding this

## 2019-05-20 ENCOUNTER — Other Ambulatory Visit: Payer: Self-pay | Admitting: Radiation Oncology

## 2019-05-20 ENCOUNTER — Ambulatory Visit
Admission: RE | Admit: 2019-05-20 | Discharge: 2019-05-20 | Disposition: A | Discharge status: Home / Self Care | Payer: Self-pay | Source: Ambulatory Visit | Attending: Radiation Oncology | Admitting: Radiation Oncology

## 2019-05-20 DIAGNOSIS — C3490 Malignant neoplasm of unspecified part of unspecified bronchus or lung: Secondary | ICD-10-CM

## 2019-08-12 ENCOUNTER — Ambulatory Visit (INDEPENDENT_AMBULATORY_CARE_PROVIDER_SITE_OTHER): Payer: Non-veteran care | Admitting: Cardiology

## 2019-08-12 DIAGNOSIS — Z5329 Procedure and treatment not carried out because of patient's decision for other reasons: Secondary | ICD-10-CM

## 2019-08-12 NOTE — Progress Notes (Signed)
No show

## 2020-11-12 ENCOUNTER — Other Ambulatory Visit: Payer: Self-pay | Admitting: Physician Assistant

## 2020-11-12 DIAGNOSIS — R918 Other nonspecific abnormal finding of lung field: Secondary | ICD-10-CM

## 2020-11-19 ENCOUNTER — Ambulatory Visit
Admission: RE | Admit: 2020-11-19 | Discharge: 2020-11-19 | Disposition: A | Discharge status: Home / Self Care | Payer: Self-pay | Source: Ambulatory Visit | Attending: Radiation Oncology | Admitting: Radiation Oncology

## 2020-11-19 ENCOUNTER — Other Ambulatory Visit: Payer: Self-pay | Admitting: Radiation Oncology

## 2020-11-19 DIAGNOSIS — C3490 Malignant neoplasm of unspecified part of unspecified bronchus or lung: Secondary | ICD-10-CM

## 2020-11-21 ENCOUNTER — Ambulatory Visit
Admission: RE | Admit: 2020-11-21 | Discharge: 2020-11-21 | Disposition: A | Discharge status: Home / Self Care | Payer: Self-pay | Source: Ambulatory Visit | Attending: Radiation Oncology | Admitting: Radiation Oncology

## 2020-11-21 ENCOUNTER — Other Ambulatory Visit: Payer: Self-pay | Admitting: Radiation Oncology

## 2020-11-21 DIAGNOSIS — C3490 Malignant neoplasm of unspecified part of unspecified bronchus or lung: Secondary | ICD-10-CM

## 2020-11-26 ENCOUNTER — Encounter (HOSPITAL_COMMUNITY): Payer: Self-pay

## 2020-11-26 ENCOUNTER — Ambulatory Visit (HOSPITAL_COMMUNITY): Payer: No Typology Code available for payment source

## 2020-11-28 ENCOUNTER — Ambulatory Visit: Payer: No Typology Code available for payment source | Admitting: Radiation Oncology

## 2020-11-28 ENCOUNTER — Ambulatory Visit: Payer: No Typology Code available for payment source

## 2020-12-05 ENCOUNTER — Telehealth: Payer: Self-pay

## 2020-12-05 NOTE — Telephone Encounter (Signed)
Called patient in regards to telephone visit with Shona Simpson PA on 12/13/20 @ 10:30am. Could not leave message due to voicemail box not set up. Called to review meaningful use questions. TM

## 2020-12-06 ENCOUNTER — Ambulatory Visit (HOSPITAL_COMMUNITY): Admission: RE | Admit: 2020-12-06 | Payer: No Typology Code available for payment source | Source: Ambulatory Visit

## 2020-12-13 ENCOUNTER — Other Ambulatory Visit: Payer: Self-pay

## 2020-12-13 ENCOUNTER — Ambulatory Visit
Admission: RE | Admit: 2020-12-13 | Discharge: 2020-12-13 | Disposition: A | Discharge status: Home / Self Care | Payer: Medicare Other | Source: Ambulatory Visit | Attending: Radiation Oncology | Admitting: Radiation Oncology

## 2020-12-13 ENCOUNTER — Encounter: Payer: Self-pay | Admitting: Radiation Oncology

## 2020-12-13 DIAGNOSIS — R911 Solitary pulmonary nodule: Secondary | ICD-10-CM

## 2020-12-13 DIAGNOSIS — C3490 Malignant neoplasm of unspecified part of unspecified bronchus or lung: Secondary | ICD-10-CM

## 2020-12-13 DIAGNOSIS — C3411 Malignant neoplasm of upper lobe, right bronchus or lung: Secondary | ICD-10-CM

## 2020-12-13 NOTE — Progress Notes (Signed)
Radiation Oncology         [phone] ________________________________  Reconsultation - Conducted via telephone due to current COVID-19 concerns for limiting patient exposure  I spoke with the patient to conduct this consult visit via telephone to spare the patient unnecessary potential exposure in the healthcare setting during the current COVID-19 pandemic. The patient was notified in advance and was offered a [place] meeting to allow for face to face communication but unfortunately reported that they did not have the appropriate resources/technology to support such a visit and instead preferred to proceed with a telephone consult.   Name: Robert Boyer        MRN: [id number]  Date of Service: 12/13/2020 DOB: 30-Nov-1945  YF:[id number], [name], MD     REFERRING PHYSICIAN: Debby Bud, [facility]  DIAGNOSIS: The primary encounter diagnosis was Right upper lobe pulmonary nodule. A diagnosis of Malignant neoplasm of unspecified part of unspecified bronchus or lung ([place]) was also pertinent to this visit.   HISTORY OF PRESENT ILLNESS: Robert Boyer is a 75 y.o. male originally seen at the request of Dr. Stana Bunting for a persistent lung nodule.  The patient has a history of smoking, but discontinued use in 2009.  He was seen after undergoing a low-dose screening CT scan in December 2019 for an 11 mm right upper lobe pulmonary nodule.  He apparently had also had an intermittent cough.  He had repeat on 12/21/2018 through the [place] system, and was noted to have persistence of this right upper lobe nodule again measuring 11 mm.  He does also have a history of enlarged mediastinal and hilar nodes in comparison dating back to 2010.  He met with Dr. Mee Hives in cardiothoracic surgery through the [facility] system, and was not felt to be a good candidate for proceeding with surgical resection or with biopsy given his comorbidities.  He was seen in our department about 2 years ago and at that time it was recommended that he  undergo PET imaging .  This was performed on 01/20/2019 and revealed no significant uptake in the right upper lobe nodule measuring 9 mm, it was recommended that he have repeat imaging in 3 months time, despite multiple attempts of our scheduling staff the patient did not go for his scans. Apparently outside films at the [place] show persistence and possible small incremental change in this lesion measuring up to 11 mm. [facility], he has a PET scan that has been ordered by Amy Hollace Hayward the PA who has seen him in medical oncology.  It has not been scheduled however.  He is contacted today to discuss next steps moving forward.    PREVIOUS RADIATION THERAPY: No   PAST MEDICAL HISTORY:  Past Medical History:  Diagnosis Date  . CHF (congestive heart failure) ([place])   . COPD (chronic obstructive pulmonary disease) ([place])   . Dyspnea 12/30/2018  . Emphysema/COPD ([place])   . Gout   . Hypertension   . Shortness of breath        PAST SURGICAL HISTORY: Past Surgical History:  Procedure Laterality Date  . ABDOMINAL SURGERY    . ARTERIAL BYPASS SURGRY    . CARDIAC CATHETERIZATION  2009     FAMILY HISTORY:  Family History  Problem Relation Age of Onset  . Cancer Mother   . Arthritis Other   . Cancer Other   . Heart attack Father      SOCIAL HISTORY:  reports that he quit  smoking about 12 years ago. His smoking use included cigarettes. He has a 64.00 pack-year smoking history. He has never used smokeless tobacco. He reports current alcohol use. He reports that he does not use drugs. The patient is widowed and lives in [place]. Since his wife's passing he's been living with his 3 adult children.   ALLERGIES: Penicillin g, Enalapril, Lisinopril, and Penicillins   MEDICATIONS:  Current Outpatient Medications  Medication Sig Dispense Refill  . acetaminophen (TYLENOL) 325 MG tablet Take 650 mg by mouth every 6 (six) hours as needed.  2 tablets as needed for pain.    Marland Kitchen allopurinol (ZYLOPRIM) 100 MG tablet Take 100 mg by mouth daily. Take 2 tablets by mouth in the morning    . ascorbic acid (VITAMIN C) 500 MG tablet Take 500 mg by mouth 2 (two) times daily. Take one tablet by mouth twice daily    . aspirin EC 81 MG tablet Take 81 mg by mouth daily.    . calcium carbonate (TUMS - DOSED IN MG ELEMENTAL CALCIUM) 500 MG chewable tablet Chew 1 tablet by mouth as needed.     . carvedilol (COREG) 25 MG tablet Take 0.5 tablets (12.5 mg total) by mouth 2 (two) times daily with a meal. 60 tablet 0  . Cholecalciferol 50 MCG (2000 UT) TABS Take 2,000 Units by mouth every other day. Take 1 tablet by mouth daily.    . furosemide (LASIX) 20 MG tablet Take 1 tablet (20 mg total) by mouth daily. 30 tablet 0  . methimazole (TAPAZOLE) 10 MG tablet Take 10 mg by mouth daily. Take one tablet by mouth daily.    . Oxcarbazepine (TRILEPTAL) 300 MG tablet Take 300 mg by mouth 2 (two) times daily. Take one half tablet by mouth once a day and take one tablet at bedtime for nerve pain.    . ranitidine (ZANTAC) 150 MG tablet Take 150 mg by mouth as needed.     . simvastatin (ZOCOR) 40 MG tablet Take 20 mg by mouth daily.    Marland Kitchen triamcinolone cream (KENALOG) 0.1 % Apply 1 application topically 2 (two) times daily as needed (for ezcema).    . valsartan (DIOVAN) 80 MG tablet Take 80 mg by mouth 2 (two) times daily. Take one-half tablet by mouth twice daily.    Marland Kitchen tiotropium (SPIRIVA) 18 MCG inhalation capsule Place 1.25 mcg into inhaler and inhale as needed. Inhale 2 puffs by mouth once a day.  (Patient not taking: Reported on 12/13/2020)     No current facility-administered medications for this encounter.     REVIEW OF SYSTEMS: On review of systems, the patient reports that he is doing poorly and reports he has the flu. After asking more information he has not been tested for this and has not had any covid19 vaccinations. He is living in a house with his three  adult children all of whom are also or have recently been ill with respiratory illnesses, and he does not know if they are vaccinated. No one in the household has tested for Covid. He reports he has had congestion, cough, but no shortness of breath or chest pain. He denies fevers or chills but has had body aches all over. His symptoms have started after 2 others in the house have become sick but he cannot remember when he first started feeling poorly, possibly late last week. Since he has been sick, the 4th person in the household has also become ill. No other complaints are verbalized.  PHYSICAL EXAM:  Unable to assess due to encounter type.  ECOG = 1  0 - Asymptomatic (Fully active, able to carry on all predisease activities without restriction)  1 - Symptomatic but completely ambulatory (Restricted in physically strenuous activity but ambulatory and able to carry out work of a light or sedentary nature. For example, light housework, office work)  2 - Symptomatic, <50% in bed during the day (Ambulatory and capable of all self care but unable to carry out any work activities. Up and about more than 50% of waking hours)  3 - Symptomatic, >50% in bed, but not bedbound (Capable of only limited self-care, confined to bed or chair 50% or more of waking hours)  4 - Bedbound (Completely disabled. Cannot carry on any self-care. Totally confined to bed or chair)  5 - Death   Eustace Pen MM, Creech RH, Tormey DC, et al. [phone]). "Toxicity and response criteria of the [facility]". [place] Oncol. 5 (6): 649-55    LABORATORY DATA:  Lab Results  Component Value Date   WBC 10.9 (H) 09/07/2014   HGB 12.9 (L) 09/07/2014   HCT 38.5 (L) 09/07/2014   MCV 93.7 09/07/2014   PLT 347 09/07/2014   Lab Results  Component Value Date   NA 138 09/09/2014   K 4.4 09/09/2014   CL 102 09/09/2014   CO2 23 09/09/2014   Lab Results  Component Value Date   ALT 18 09/07/2014   AST 20  09/07/2014   ALKPHOS 62 09/07/2014   BILITOT 0.7 09/07/2014      RADIOGRAPHY: No results found.     IMPRESSION/PLAN: 1. Persistent right upper lobe nodule. Dr. Lisbeth Renshaw has reviewed the patient's case and agrees that with recent imaging showing persistence of his nodule in the RUL, it would be recommended that he undergo a PET scan. We will follow up with these results and given he is not a candidate for biopsy or surgical resection, would consider stereotactic body radiotherapy (SBRT) without tissue confirmation. The patient is in agreement. We will discuss treatment details as appropriate when we follow up with his PET scan results. We will delay having the scan however until his respiratory illness, likely Covid19 is no longer contagious. 2. Upper respiratory symptoms. The patient has symptoms of Covid19 and is unvaccinated. He seemed to hear what I was saying during our call, but I am very concerned that he actually has Covid and encouraged he and for his children in the household to get tested for this. I reviewed CDC guidance for quarantining and recommend he not go out in public other than for testing until after quarantine phase. He is in agreement, but I did suggest that he do this as soon as possible as he would be a candidate for monoclonal antibody treatment given his comorbidities and risk for severe disease. I asked him to notify us if he does get tested so we can try to help coordinate infusion/oral antiviral therapy.  3. History of cardiopulmonary disease. The patient does not have an ongoing relationship with pulmonology or cardiology. I recommended that he discuss his history with Dr. Marjo Bicker and determine with her if he needs to have these specialists involved in his care, or if she would manage these issues. He is in agreement with this plan.   Given current concerns for patient exposure during the COVID-19 pandemic, this encounter was conducted via telephone.  The patient has provided  two factor identification and has given verbal consent for  this type of encounter and has been advised to only accept a meeting of this type in a secure network environment. The time spent during this encounter was 60 minutes including preparation, discussion, and coordination of the patient's care. The attendants for this meeting include   Hayden Pedro  and Lavon Paganini.  During the encounter,   Hayden Pedro was located at [facility] Radiation Oncology Department.  Robert Boyer was located at home.     Carola Rhine, PAC

## 2021-01-11 ENCOUNTER — Ambulatory Visit (HOSPITAL_COMMUNITY)
Admission: RE | Admit: 2021-01-11 | Discharge: 2021-01-11 | Disposition: A | Discharge status: Home / Self Care | Payer: No Typology Code available for payment source | Source: Ambulatory Visit | Attending: Radiation Oncology | Admitting: Radiation Oncology

## 2021-01-11 ENCOUNTER — Other Ambulatory Visit: Payer: Self-pay

## 2021-01-11 DIAGNOSIS — C3490 Malignant neoplasm of unspecified part of unspecified bronchus or lung: Secondary | ICD-10-CM | POA: Insufficient documentation

## 2021-01-11 LAB — GLUCOSE, CAPILLARY: Glucose-Capillary: 122 mg/dL — ABNORMAL HIGH (ref 70–99)

## 2021-01-11 MED ORDER — FLUDEOXYGLUCOSE F - 18 (FDG) INJECTION
8.5300 | Freq: Once | INTRAVENOUS | Status: AC
  Administered 2021-01-11: 8.53 via INTRAVENOUS

## 2021-01-14 NOTE — Progress Notes (Signed)
This is the pt who hasn't had treatment but fell off the radar with the [place] for a while, then resurfaced with persistent RUL nodule. Do you think we should just keep a close eye on him with CT in 4-6 months?

## 2021-01-15 ENCOUNTER — Telehealth: Payer: Self-pay | Admitting: Radiation Oncology

## 2021-01-15 DIAGNOSIS — R911 Solitary pulmonary nodule: Secondary | ICD-10-CM

## 2021-01-15 NOTE — Telephone Encounter (Signed)
I called the patient to see how he was doing and let him know that Dr. Lisbeth Renshaw reviewed his PET scan. It was recommended that he proceed with repeat CT in 4-6 months as there was not any significant hypermetabolic change in the chest of concern especially as the patient recently had respiratory symptoms. I cannot reach him by phone, he does not have mychart set up or other family members or points of contact. We will plan to repeat his CT in 4-6 months.     Carola Rhine, PAC

## 2021-05-24 ENCOUNTER — Other Ambulatory Visit: Payer: Self-pay

## 2021-05-24 ENCOUNTER — Ambulatory Visit (HOSPITAL_COMMUNITY)
Admission: RE | Admit: 2021-05-24 | Discharge: 2021-05-24 | Disposition: A | Discharge status: Home / Self Care | Payer: No Typology Code available for payment source | Source: Ambulatory Visit | Attending: Radiation Oncology | Admitting: Radiation Oncology

## 2021-05-24 ENCOUNTER — Encounter (HOSPITAL_COMMUNITY): Payer: Self-pay

## 2021-05-24 DIAGNOSIS — R911 Solitary pulmonary nodule: Secondary | ICD-10-CM | POA: Diagnosis present

## 2021-05-24 LAB — POCT I-STAT CREATININE: Creatinine, Ser: 1.3 mg/dL — ABNORMAL HIGH (ref 0.61–1.24)

## 2021-05-24 MED ORDER — DIPHENHYDRAMINE HCL 25 MG PO CAPS
ORAL_CAPSULE | ORAL | Status: AC
  Filled 2021-05-24: qty 1

## 2021-05-24 MED ORDER — IOHEXOL 350 MG/ML SOLN
60.0000 mL | Freq: Once | INTRAVENOUS | Status: AC | PRN
  Administered 2021-05-24: 60 mL via INTRAVENOUS

## 2021-05-24 MED ORDER — DIPHENHYDRAMINE HCL 25 MG PO CAPS
25.0000 mg | ORAL_CAPSULE | Freq: Once | ORAL | Status: AC
  Administered 2021-05-24: 25 mg via ORAL

## 2021-05-24 NOTE — Progress Notes (Signed)
Patient ID: Robert Boyer, male   DOB: 1946-08-24, 74 y.o.   MRN: [id number] Patient presented to [facility] today  for CT chest with contrast.  Following exam he started to itch in his lower back region . He denied any respiratory complaints .  On exam there were a few hives noted on patient's left flank region.  There was no evidence of facial or tongue edema or respiratory distress/wheezing.  No known history of contrast allergy.  Patient was given Benadryl 25 mg p.o. and  observed in the radiology department for 30 to 45 minutes afterwards without any further sequela.  He was discharged home with instructions to monitor for any new symptoms and seek treatment immediately if additional changes occur.

## 2021-05-27 ENCOUNTER — Encounter: Payer: Self-pay | Admitting: Radiation Oncology

## 2021-05-27 ENCOUNTER — Other Ambulatory Visit: Payer: Self-pay

## 2021-05-27 ENCOUNTER — Ambulatory Visit
Admission: RE | Admit: 2021-05-27 | Discharge: 2021-05-27 | Disposition: A | Discharge status: Home / Self Care | Payer: Medicare Other | Source: Ambulatory Visit | Attending: Radiation Oncology | Admitting: Radiation Oncology

## 2021-05-27 DIAGNOSIS — R911 Solitary pulmonary nodule: Secondary | ICD-10-CM

## 2021-05-27 MED ORDER — PREDNISONE 50 MG PO TABS: ORAL_TABLET | ORAL | 5 refills | Status: AC

## 2021-05-27 NOTE — Progress Notes (Signed)
Patient due to follow up appointment today. Meaningful use  question completed. Made patient aware that appointment today will be a telephone visit. Patient voiced understanding.

## 2021-05-27 NOTE — Progress Notes (Signed)
Radiation Oncology         [phone] ________________________________  Follow Up- Conducted via telephone due to current COVID-19 concerns for limiting patient exposure  I spoke with the patient to conduct this consult visit via telephone to spare the patient unnecessary potential exposure in the healthcare setting during the current COVID-19 pandemic. The patient was notified in advance and was offered a [place] meeting to allow for face to face communication but unfortunately reported that they did not have the appropriate resources/technology to support such a visit and instead preferred to proceed with a telephone visit.  Name: Robert Boyer        MRN: [id number]  Date of Service: 05/27/2021 DOB: 08/22/1946  YE:[id number], Robert Dresser, MD     REFERRING PHYSICIAN: Debby Bud, [facility]  DIAGNOSIS: The encounter diagnosis was Right upper lobe pulmonary nodule.   HISTORY OF PRESENT ILLNESS: Robert Boyer is a 75 y.o. male originally seen at the request of Dr. Stana Bunting for a persistent lung nodule.  The patient has a history of smoking, but discontinued use in 2009.  He was seen after undergoing a low-dose screening CT scan in December 2019 for an 11 mm right upper lobe pulmonary nodule.  He apparently had also had an intermittent cough.  He had repeat on 12/21/2018 through the [place] system, and was noted to have persistence of this right upper lobe nodule again measuring 11 mm.  He does also have a history of enlarged mediastinal and hilar nodes in comparison dating back to 2010.  He met with Dr. Mee Hives in cardiothoracic surgery through the [facility] system, and was not felt to be a good candidate for proceeding with surgical resection or with biopsy given his comorbidities.  He was seen in our department about 2 years ago and at that time it was recommended that he undergo PET imaging .  This was performed on 01/20/2019 and revealed no significant uptake in the right upper lobe nodule measuring 9 mm, it was  recommended that he have repeat imaging in 3 months time, despite multiple attempts of our scheduling staff the patient did not go for his scans. Apparently outside films at the [place] show persistence and possible small incremental change in this lesion measuring up to 11 mm. He did have another PET scan on 01/11/21 that showed the 8 x 13 mm nodule in the RLL which was not hypermetabolic, and again mild thoracic adenopathy was present but unchanged from November 2021, but was actually improved from February 2020. He's contacted today to review his most recent CT on 05/24/21 that identified the RUL lesion along the oblique fissure measuring 14 x 6 mm, and stable prevascular, right hilar, and subcarinal adenopathy.    PREVIOUS RADIATION THERAPY: No   PAST MEDICAL HISTORY:  Past Medical History:  Diagnosis Date   CHF (congestive heart failure) (HCC)    COPD (chronic obstructive pulmonary disease) ([place])    Dyspnea 12/30/2018   Emphysema/COPD (HCC)    Gout    Hypertension    Shortness of breath        PAST SURGICAL HISTORY: Past Surgical History:  Procedure Laterality Date   ABDOMINAL SURGERY     ARTERIAL BYPASS SURGRY     CARDIAC CATHETERIZATION  2009     FAMILY HISTORY:  Family History  Problem Relation Age of Onset   Cancer Mother    Arthritis Other    Cancer Other    Heart attack Father      SOCIAL HISTORY:  reports  that he quit smoking about 13 years ago. His smoking use included cigarettes. He has a 64.00 pack-year smoking history. He has never used smokeless tobacco. He reports current alcohol use. He reports that he does not use drugs. The patient is widowed and lives in [place]. Since his wife's passing he's been living with his 3 adult children.   ALLERGIES: Penicillin g, Enalapril, Iodinated diagnostic agents, Lisinopril, and Penicillins   MEDICATIONS:  Current Outpatient Medications  Medication Sig Dispense Refill   acetaminophen (TYLENOL) 325 MG tablet Take 650 mg  by mouth every 6 (six) hours as needed. 2 tablets as needed for pain.     allopurinol (ZYLOPRIM) 100 MG tablet Take 100 mg by mouth daily. Take 2 tablets by mouth in the morning     ascorbic acid (VITAMIN C) 500 MG tablet Take 500 mg by mouth 2 (two) times daily. Take one tablet by mouth twice daily     aspirin EC 81 MG tablet Take 81 mg by mouth daily.     calcium carbonate (TUMS - DOSED IN MG ELEMENTAL CALCIUM) 500 MG chewable tablet Chew 1 tablet by mouth as needed.      carvedilol (COREG) 25 MG tablet Take 0.5 tablets (12.5 mg total) by mouth 2 (two) times daily with a meal. 60 tablet 0   Cholecalciferol 50 MCG (2000 UT) TABS Take 2,000 Units by mouth every other day. Take 1 tablet by mouth daily.     furosemide (LASIX) 20 MG tablet Take 1 tablet (20 mg total) by mouth daily. 30 tablet 0   methimazole (TAPAZOLE) 10 MG tablet Take 10 mg by mouth daily. Take one tablet by mouth daily.     Oxcarbazepine (TRILEPTAL) 300 MG tablet Take 300 mg by mouth 2 (two) times daily. Take one half tablet by mouth once a day and take one tablet at bedtime for nerve pain.     ranitidine (ZANTAC) 150 MG tablet Take 150 mg by mouth as needed.      simvastatin (ZOCOR) 40 MG tablet Take 20 mg by mouth daily.     tiotropium (SPIRIVA) 18 MCG inhalation capsule Place 1.25 mcg into inhaler and inhale as needed. Inhale 2 puffs by mouth once a day.  (Patient not taking: Reported on 12/13/2020)     triamcinolone cream (KENALOG) 0.1 % Apply 1 application topically 2 (two) times daily as needed (for ezcema).     valsartan (DIOVAN) 80 MG tablet Take 80 mg by mouth 2 (two) times daily. Take one-half tablet by mouth twice daily.     No current facility-administered medications for this encounter.     REVIEW OF SYSTEMS: On review of systems, the patient reports that he is doing fairly well overall. He denies any trouble breathing or chest pain. He does have shortness of breath when exerting himself.    PHYSICAL EXAM:  Unable  to assess due to encounter type.  ECOG = 1  0 - Asymptomatic (Fully active, able to carry on all predisease activities without restriction)  1 - Symptomatic but completely ambulatory (Restricted in physically strenuous activity but ambulatory and able to carry out work of a light or sedentary nature. For example, light housework, office work)  2 - Symptomatic, <50% in bed during the day (Ambulatory and capable of all self care but unable to carry out any work activities. Up and about more than 50% of waking hours)  3 - Symptomatic, >50% in bed, but not bedbound (Capable of only limited self-care, confined to  bed or chair 50% or more of waking hours)  4 - Bedbound (Completely disabled. Cannot carry on any self-care. Totally confined to bed or chair)  5 - Death   Eustace Pen MM, Creech RH, Tormey DC, et al. [phone]). "Toxicity and response criteria of the [facility] Group". [place] Oncol. 5 (6): 649-55    LABORATORY DATA:  Lab Results  Component Value Date   WBC 10.9 (H) 09/07/2014   HGB 12.9 (L) 09/07/2014   HCT 38.5 (L) 09/07/2014   MCV 93.7 09/07/2014   PLT 347 09/07/2014   Lab Results  Component Value Date   NA 138 09/09/2014   K 4.4 09/09/2014   CL 102 09/09/2014   CO2 23 09/09/2014   Lab Results  Component Value Date   ALT 18 09/07/2014   AST 20 09/07/2014   ALKPHOS 62 09/07/2014   BILITOT 0.7 09/07/2014      RADIOGRAPHY: CT CHEST W CONTRAST  Result Date: 05/26/2021 CLINICAL DATA:  Non metabolic nodule identified on FDG PET scan. Follow-up CT recommended. EXAM: CT CHEST WITH CONTRAST TECHNIQUE: Multidetector CT imaging of the chest was performed during intravenous contrast administration. CONTRAST:  22m OMNIPAQUE IOHEXOL 350 MG/ML SOLN COMPARISON:  PET-CT 01/11/2021 FINDINGS: Cardiovascular: Coronary artery calcification and aortic atherosclerotic calcification. Mediastinum/Nodes: Nodule enlargement of the LEFT thyroid gland extends into the  substernal location. No change from prior. Moderate mediastinal adenopathy again demonstrated. RIGHT lower paratracheal node measuring 15 mm compares to 15 mm. Prevascular node measures 12 mm also unchanged. RIGHT hilar adenopathy and subcarinal adenopathy is also unchanged. Lungs/Pleura: Nodule concern along superior segment of the the RIGHT oblique fissure measures 14 mm x 6 mm which compares to 13 mm x 8 mm on PET-CT scan 3 02/2021 and 12 mm by 8 mm on CT 10/02/2020. Nodule measures 6 mm by 8 mm on 05/04/2019. Nodule is not hypermetabolic on comparison FDG PET scan. Upper Abdomen: Limited view of the liver, kidneys, pancreas are unremarkable. Normal adrenal glands. LEFT kidney is atrophic Musculoskeletal: IMPRESSION: 1. No significant change in short interval follow-up from PET-CT 01/12/2019. Nodule has enlarged from May 04, 2019. Enlarging nodules typically warrant tissue sampling. At minimum recommend continued CT surveillance. 2. Stable mediastinal adenopathy. Electronically Signed   By: SSuzy BouchardM.D.   On: 05/26/2021 15:51       IMPRESSION/PLAN: 1. Persistent right upper lobe nodule. I discussed the findings from his CT scan. Given that this remains quite stable overall we will repeat another CT with contrast in 4 months time. If there is another incrimental change, we would likely proceed with SBRT since he is not a candidate for biopsy or surgical resection. He is in agreement with this plan. 2. CT contrast sensitivity. Seeing his nodes on CT scan with contrast would be ideal and he is in agreement to continue this but we discussed premedicating with prednisone and benadryl prior to his next scan and a new prescription was sent into his pharmacy.  2. History of cardiopulmonary disease. The patient will discuss further Dr. MMarjo Bickerbut remains stable overall.  Given current concerns for patient exposure during the COVID-19 pandemic, this encounter was conducted via telephone.  The patient has  provided two factor identification and has given verbal consent for this type of encounter and has been advised to only accept a meeting of this type in a secure network environment. The time spent during this encounter was 35 minutes including preparation, discussion, and coordination of the patient's care. The  attendants for this meeting include   Hayden Pedro  and Lavon Paganini.  During the encounter,   Hayden Pedro was located at [facility] Radiation Oncology Department.  Phillip Maffei was located at home.     Carola Rhine, PAC

## 2021-05-28 ENCOUNTER — Encounter: Payer: Self-pay | Admitting: Radiation Oncology

## 2021-05-28 NOTE — Progress Notes (Signed)
I spoke with Debby Bud at the [facility] and she will still plan to see the pt in August of this year, and if at his next scan the nodule we're following is stable or regressed, she will resume screening in the lung cancer screening clinic at the [place].

## 2021-07-12 ENCOUNTER — Encounter (HOSPITAL_COMMUNITY): Admission: RE | Disposition: E | Payer: Self-pay | Source: Home / Self Care | Attending: Cardiology

## 2021-07-12 ENCOUNTER — Inpatient Hospital Stay (HOSPITAL_COMMUNITY): Payer: No Typology Code available for payment source | Admitting: Certified Registered Nurse Anesthetist

## 2021-07-12 ENCOUNTER — Emergency Department (HOSPITAL_COMMUNITY): Payer: No Typology Code available for payment source

## 2021-07-12 ENCOUNTER — Inpatient Hospital Stay (HOSPITAL_COMMUNITY): Admission: RE | Disposition: E | Payer: Self-pay | Source: Home / Self Care | Attending: Cardiology

## 2021-07-12 ENCOUNTER — Inpatient Hospital Stay (HOSPITAL_COMMUNITY)
Admission: RE | Admit: 2021-07-12 | Discharge: 2021-08-10 | DRG: 215 | Disposition: E | Payer: No Typology Code available for payment source | Attending: Cardiology | Admitting: Cardiology

## 2021-07-12 DIAGNOSIS — I771 Stricture of artery: Secondary | ICD-10-CM | POA: Diagnosis present

## 2021-07-12 DIAGNOSIS — I130 Hypertensive heart and chronic kidney disease with heart failure and stage 1 through stage 4 chronic kidney disease, or unspecified chronic kidney disease: Secondary | ICD-10-CM | POA: Diagnosis present

## 2021-07-12 DIAGNOSIS — Z419 Encounter for procedure for purposes other than remedying health state, unspecified: Secondary | ICD-10-CM

## 2021-07-12 DIAGNOSIS — I350 Nonrheumatic aortic (valve) stenosis: Secondary | ICD-10-CM | POA: Diagnosis present

## 2021-07-12 DIAGNOSIS — M109 Gout, unspecified: Secondary | ICD-10-CM | POA: Diagnosis present

## 2021-07-12 DIAGNOSIS — I9581 Postprocedural hypotension: Secondary | ICD-10-CM | POA: Diagnosis not present

## 2021-07-12 DIAGNOSIS — J439 Emphysema, unspecified: Secondary | ICD-10-CM | POA: Diagnosis present

## 2021-07-12 DIAGNOSIS — Z87891 Personal history of nicotine dependence: Secondary | ICD-10-CM

## 2021-07-12 DIAGNOSIS — R570 Cardiogenic shock: Secondary | ICD-10-CM | POA: Diagnosis present

## 2021-07-12 DIAGNOSIS — Z888 Allergy status to other drugs, medicaments and biological substances status: Secondary | ICD-10-CM

## 2021-07-12 DIAGNOSIS — I5022 Chronic systolic (congestive) heart failure: Secondary | ICD-10-CM | POA: Diagnosis present

## 2021-07-12 DIAGNOSIS — I272 Pulmonary hypertension, unspecified: Secondary | ICD-10-CM | POA: Diagnosis present

## 2021-07-12 DIAGNOSIS — I739 Peripheral vascular disease, unspecified: Secondary | ICD-10-CM | POA: Diagnosis present

## 2021-07-12 DIAGNOSIS — Z880 Allergy status to penicillin: Secondary | ICD-10-CM | POA: Diagnosis not present

## 2021-07-12 DIAGNOSIS — N1831 Chronic kidney disease, stage 3a: Secondary | ICD-10-CM | POA: Diagnosis present

## 2021-07-12 DIAGNOSIS — Z20822 Contact with and (suspected) exposure to covid-19: Secondary | ICD-10-CM | POA: Diagnosis present

## 2021-07-12 DIAGNOSIS — Z515 Encounter for palliative care: Secondary | ICD-10-CM

## 2021-07-12 DIAGNOSIS — I255 Ischemic cardiomyopathy: Secondary | ICD-10-CM | POA: Diagnosis present

## 2021-07-12 DIAGNOSIS — R911 Solitary pulmonary nodule: Secondary | ICD-10-CM | POA: Diagnosis present

## 2021-07-12 DIAGNOSIS — I25118 Atherosclerotic heart disease of native coronary artery with other forms of angina pectoris: Secondary | ICD-10-CM | POA: Diagnosis present

## 2021-07-12 DIAGNOSIS — Z95820 Peripheral vascular angioplasty status with implants and grafts: Secondary | ICD-10-CM

## 2021-07-12 DIAGNOSIS — I2102 ST elevation (STEMI) myocardial infarction involving left anterior descending coronary artery: Secondary | ICD-10-CM | POA: Diagnosis not present

## 2021-07-12 DIAGNOSIS — I110 Hypertensive heart disease with heart failure: Secondary | ICD-10-CM | POA: Diagnosis present

## 2021-07-12 DIAGNOSIS — I213 ST elevation (STEMI) myocardial infarction of unspecified site: Secondary | ICD-10-CM | POA: Diagnosis present

## 2021-07-12 DIAGNOSIS — R0602 Shortness of breath: Secondary | ICD-10-CM

## 2021-07-12 DIAGNOSIS — I2109 ST elevation (STEMI) myocardial infarction involving other coronary artery of anterior wall: Secondary | ICD-10-CM | POA: Diagnosis present

## 2021-07-12 DIAGNOSIS — Z91041 Radiographic dye allergy status: Secondary | ICD-10-CM

## 2021-07-12 DIAGNOSIS — Z8249 Family history of ischemic heart disease and other diseases of the circulatory system: Secondary | ICD-10-CM

## 2021-07-12 DIAGNOSIS — I25110 Atherosclerotic heart disease of native coronary artery with unstable angina pectoris: Secondary | ICD-10-CM | POA: Diagnosis not present

## 2021-07-12 DIAGNOSIS — R599 Enlarged lymph nodes, unspecified: Secondary | ICD-10-CM | POA: Diagnosis present

## 2021-07-12 DIAGNOSIS — I2129 ST elevation (STEMI) myocardial infarction involving other sites: Secondary | ICD-10-CM | POA: Diagnosis not present

## 2021-07-12 HISTORY — PX: TEE WITHOUT CARDIOVERSION: SHX5443

## 2021-07-12 HISTORY — PX: CORONARY/GRAFT ACUTE MI REVASCULARIZATION: CATH118305

## 2021-07-12 HISTORY — PX: PLACEMENT OF IMPELLA LEFT VENTRICULAR ASSIST DEVICE: SHX6519

## 2021-07-12 HISTORY — PX: ABDOMINAL AORTOGRAM: CATH118222

## 2021-07-12 HISTORY — PX: CORONARY ARTERY BYPASS GRAFT: SHX141

## 2021-07-12 HISTORY — PX: RIGHT/LEFT HEART CATH AND CORONARY ANGIOGRAPHY: CATH118266

## 2021-07-12 LAB — HEMOGLOBIN A1C
Hgb A1c MFr Bld: 6.2 % — ABNORMAL HIGH (ref 4.8–5.6)
Mean Plasma Glucose: 131.24 mg/dL

## 2021-07-12 LAB — HEMOGLOBIN AND HEMATOCRIT, BLOOD
HCT: 22.1 % — ABNORMAL LOW (ref 39.0–52.0)
Hemoglobin: 7.1 g/dL — ABNORMAL LOW (ref 13.0–17.0)

## 2021-07-12 LAB — PROTIME-INR
INR: 1.4 — ABNORMAL HIGH (ref 0.8–1.2)
Prothrombin Time: 17.4 seconds — ABNORMAL HIGH (ref 11.4–15.2)

## 2021-07-12 LAB — LIPID PANEL
Cholesterol: 83 mg/dL (ref 0–200)
HDL: 22 mg/dL — ABNORMAL LOW (ref >40)
LDL Cholesterol: 28 mg/dL (ref 0–99)
Total CHOL/HDL Ratio: 3.8 RATIO
Triglycerides: 164 mg/dL — ABNORMAL HIGH (ref <150)
VLDL: 33 mg/dL (ref 0–40)

## 2021-07-12 LAB — COMPREHENSIVE METABOLIC PANEL
ALT: 34 U/L (ref 0–44)
AST: 78 U/L — ABNORMAL HIGH (ref 15–41)
Albumin: 3.7 g/dL (ref 3.5–5.0)
Alkaline Phosphatase: 43 U/L (ref 38–126)
Anion gap: 11 (ref 5–15)
BUN: 25 mg/dL — ABNORMAL HIGH (ref 8–23)
CO2: 17 mmol/L — ABNORMAL LOW (ref 22–32)
Calcium: 8.8 mg/dL — ABNORMAL LOW (ref 8.9–10.3)
Chloride: 108 mmol/L (ref 98–111)
Creatinine, Ser: 1.53 mg/dL — ABNORMAL HIGH (ref 0.61–1.24)
GFR, Estimated: 48 mL/min — ABNORMAL LOW (ref >60)
Glucose, Bld: 122 mg/dL — ABNORMAL HIGH (ref 70–99)
Potassium: 4.4 mmol/L (ref 3.5–5.1)
Sodium: 136 mmol/L (ref 135–145)
Total Bilirubin: 0.8 mg/dL (ref 0.3–1.2)
Total Protein: 7.4 g/dL (ref 6.5–8.1)

## 2021-07-12 LAB — POCT I-STAT EG7
Acid-base deficit: 6 mmol/L — ABNORMAL HIGH (ref 0.0–2.0)
Acid-base deficit: 7 mmol/L — ABNORMAL HIGH (ref 0.0–2.0)
Bicarbonate: 19.5 mmol/L — ABNORMAL LOW (ref 20.0–28.0)
Bicarbonate: 17.3 mmol/L — ABNORMAL LOW (ref 20.0–28.0)
Calcium, Ion: 1.22 mmol/L (ref 1.15–1.40)
Calcium, Ion: 1.25 mmol/L (ref 1.15–1.40)
HCT: 37 % — ABNORMAL LOW (ref 39.0–52.0)
HCT: 38 % — ABNORMAL LOW (ref 39.0–52.0)
Hemoglobin: 12.6 g/dL — ABNORMAL LOW (ref 13.0–17.0)
Hemoglobin: 12.9 g/dL — ABNORMAL LOW (ref 13.0–17.0)
O2 Saturation: 53 %
O2 Saturation: 92 %
Potassium: 4.8 mmol/L (ref 3.5–5.1)
Potassium: 4.9 mmol/L (ref 3.5–5.1)
Sodium: 140 mmol/L (ref 135–145)
Sodium: 141 mmol/L (ref 135–145)
TCO2: 21 mmol/L — ABNORMAL LOW (ref 22–32)
TCO2: 18 mmol/L — ABNORMAL LOW (ref 22–32)
pCO2, Ven: 39 mmHg — ABNORMAL LOW (ref 44.0–60.0)
pCO2, Ven: 31 mmHg — ABNORMAL LOW (ref 44.0–60.0)
pH, Ven: 7.307 (ref 7.250–7.430)
pH, Ven: 7.355 (ref 7.250–7.430)
pO2, Ven: 31 mmHg — CL (ref 32.0–45.0)
pO2, Ven: 66 mmHg — ABNORMAL HIGH (ref 32.0–45.0)

## 2021-07-12 LAB — CBC WITH DIFFERENTIAL/PLATELET
Abs Immature Granulocytes: 0.06 10*3/uL (ref 0.00–0.07)
Basophils Absolute: 0 10*3/uL (ref 0.0–0.1)
Basophils Relative: 0 %
Eosinophils Absolute: 0 10*3/uL (ref 0.0–0.5)
Eosinophils Relative: 0 %
HCT: 36.6 % — ABNORMAL LOW (ref 39.0–52.0)
Hemoglobin: 12.1 g/dL — ABNORMAL LOW (ref 13.0–17.0)
Immature Granulocytes: 0 %
Lymphocytes Relative: 7 %
Lymphs Abs: 1.1 10*3/uL (ref 0.7–4.0)
MCH: 34.3 pg — ABNORMAL HIGH (ref 26.0–34.0)
MCHC: 33.1 g/dL (ref 30.0–36.0)
MCV: 103.7 fL — ABNORMAL HIGH (ref 80.0–100.0)
Monocytes Absolute: 0.9 10*3/uL (ref 0.1–1.0)
Monocytes Relative: 6 %
Neutro Abs: 12.6 10*3/uL — ABNORMAL HIGH (ref 1.7–7.7)
Neutrophils Relative %: 87 %
Platelets: 159 10*3/uL (ref 150–400)
RBC: 3.53 MIL/uL — ABNORMAL LOW (ref 4.22–5.81)
RDW: 14.8 % (ref 11.5–15.5)
WBC: 14.8 10*3/uL — ABNORMAL HIGH (ref 4.0–10.5)
nRBC: 0 % (ref 0.0–0.2)

## 2021-07-12 LAB — PLATELET COUNT
Platelets: 127 10*3/uL — ABNORMAL LOW (ref 150–400)
Platelets: 105 10*3/uL — ABNORMAL LOW (ref 150–400)

## 2021-07-12 LAB — FIBRINOGEN: Fibrinogen: 161 mg/dL — ABNORMAL LOW (ref 210–475)

## 2021-07-12 LAB — APTT: aPTT: 179 seconds (ref 24–36)

## 2021-07-12 LAB — POCT I-STAT, CHEM 8
BUN: 27 mg/dL — ABNORMAL HIGH (ref 8–23)
Calcium, Ion: 1.26 mmol/L (ref 1.15–1.40)
Chloride: 109 mmol/L (ref 98–111)
Creatinine, Ser: 1.5 mg/dL — ABNORMAL HIGH (ref 0.61–1.24)
Glucose, Bld: 128 mg/dL — ABNORMAL HIGH (ref 70–99)
HCT: 35 % — ABNORMAL LOW (ref 39.0–52.0)
Hemoglobin: 11.9 g/dL — ABNORMAL LOW (ref 13.0–17.0)
Potassium: 4.6 mmol/L (ref 3.5–5.1)
Sodium: 139 mmol/L (ref 135–145)
TCO2: 18 mmol/L — ABNORMAL LOW (ref 22–32)

## 2021-07-12 LAB — RESP PANEL BY RT-PCR (FLU A&B, COVID) ARPGX2
Influenza A by PCR: NEGATIVE
Influenza B by PCR: NEGATIVE
SARS Coronavirus 2 by RT PCR: NEGATIVE

## 2021-07-12 LAB — POCT ACTIVATED CLOTTING TIME: Activated Clotting Time: 150 seconds

## 2021-07-12 LAB — TROPONIN I (HIGH SENSITIVITY): Troponin I (High Sensitivity): 6839 ng/L (ref <18)

## 2021-07-12 LAB — ABO/RH: ABO/RH(D): A POS

## 2021-07-12 SURGERY — CORONARY/GRAFT ACUTE MI REVASCULARIZATION
Anesthesia: LOCAL

## 2021-07-12 SURGERY — CORONARY ARTERY BYPASS GRAFTING (CABG)
Anesthesia: General | Site: Chest

## 2021-07-12 MED ORDER — VANCOMYCIN HCL 1250 MG/250ML IV SOLN
1250.0000 mg | INTRAVENOUS | Status: AC
  Administered 2021-07-12: 1250 mg via INTRAVENOUS
  Filled 2021-07-12 (×2): qty 250

## 2021-07-12 MED ORDER — MILRINONE LACTATE IN DEXTROSE 20-5 MG/100ML-% IV SOLN
0.3000 ug/kg/min | INTRAVENOUS | Status: DC
  Filled 2021-07-12 (×2): qty 100

## 2021-07-12 MED ORDER — HEPARIN SODIUM (PORCINE) 5000 UNIT/ML IJ SOLN
Freq: Once | INTRAVENOUS | Status: DC
  Filled 2021-07-12 (×2): qty 10

## 2021-07-12 MED ORDER — TRANEXAMIC ACID 1000 MG/10ML IV SOLN
1.5000 mg/kg/h | INTRAVENOUS | Status: DC
  Filled 2021-07-12 (×2): qty 25

## 2021-07-12 MED ORDER — PLASMA-LYTE A IV SOLN
INTRAVENOUS | Status: AC
  Administered 2021-07-12: 1000 mL
  Filled 2021-07-12: qty 5

## 2021-07-12 MED ORDER — INSULIN REGULAR(HUMAN) IN NACL 100-0.9 UT/100ML-% IV SOLN
INTRAVENOUS | Status: AC
  Administered 2021-07-12: 1.5 [IU]/h via INTRAVENOUS
  Filled 2021-07-12: qty 100

## 2021-07-12 MED ORDER — PROPOFOL 10 MG/ML IV BOLUS
INTRAVENOUS | Status: DC | PRN
  Administered 2021-07-12 (×2): 20 mg via INTRAVENOUS

## 2021-07-12 MED ORDER — LIDOCAINE 2% (20 MG/ML) 5 ML SYRINGE
INTRAMUSCULAR | Status: DC | PRN
  Administered 2021-07-12: 60 mg via INTRAVENOUS
  Administered 2021-07-12: 40 mg via INTRAVENOUS

## 2021-07-12 MED ORDER — SODIUM CHLORIDE 0.9 % IV SOLN: INTRAVENOUS | Status: DC | PRN

## 2021-07-12 MED ORDER — LIDOCAINE HCL (PF) 1 % IJ SOLN
INTRAMUSCULAR | Status: AC
  Filled 2021-07-12: qty 30

## 2021-07-12 MED ORDER — MIDAZOLAM HCL 2 MG/2ML IJ SOLN
INTRAMUSCULAR | Status: DC | PRN
  Administered 2021-07-12: 1 mg via INTRAVENOUS

## 2021-07-12 MED ORDER — METHYLPREDNISOLONE SODIUM SUCC 125 MG IJ SOLR
INTRAMUSCULAR | Status: AC
  Filled 2021-07-12: qty 2

## 2021-07-12 MED ORDER — HEPARIN (PORCINE) IN NACL 1000-0.9 UT/500ML-% IV SOLN
INTRAVENOUS | Status: DC | PRN
  Administered 2021-07-12 (×2): 500 mL

## 2021-07-12 MED ORDER — HEPARIN SODIUM (PORCINE) 1000 UNIT/ML IJ SOLN
INTRAMUSCULAR | Status: AC
  Filled 2021-07-12: qty 1

## 2021-07-12 MED ORDER — IOHEXOL 350 MG/ML SOLN
INTRAVENOUS | Status: DC | PRN
  Administered 2021-07-12: 100 mL

## 2021-07-12 MED ORDER — FENTANYL CITRATE (PF) 250 MCG/5ML IJ SOLN
INTRAMUSCULAR | Status: AC
  Filled 2021-07-12: qty 20

## 2021-07-12 MED ORDER — METHYLPREDNISOLONE SODIUM SUCC 125 MG IJ SOLR
INTRAMUSCULAR | Status: DC | PRN
  Administered 2021-07-12: 125 mg via INTRAVENOUS

## 2021-07-12 MED ORDER — SODIUM CHLORIDE 0.9 % IV SOLN
INTRAVENOUS | Status: DC
  Filled 2021-07-12: qty 30

## 2021-07-12 MED ORDER — NITROGLYCERIN IN D5W 200-5 MCG/ML-% IV SOLN
2.0000 ug/min | INTRAVENOUS | Status: DC
  Filled 2021-07-12: qty 250

## 2021-07-12 MED ORDER — MIDAZOLAM HCL 2 MG/2ML IJ SOLN
INTRAMUSCULAR | Status: AC
  Filled 2021-07-12: qty 2

## 2021-07-12 MED ORDER — FENTANYL CITRATE (PF) 100 MCG/2ML IJ SOLN
INTRAMUSCULAR | Status: DC | PRN
  Administered 2021-07-12: 25 ug via INTRAVENOUS

## 2021-07-12 MED ORDER — 0.9 % SODIUM CHLORIDE (POUR BTL) OPTIME
TOPICAL | Status: DC | PRN
  Administered 2021-07-12: 5000 mL

## 2021-07-12 MED ORDER — TRANEXAMIC ACID (OHS) PUMP PRIME SOLUTION
2.0000 mg/kg | INTRAVENOUS | Status: DC
  Filled 2021-07-12: qty 1.61

## 2021-07-12 MED ORDER — NOREPINEPHRINE 4 MG/250ML-% IV SOLN
0.0000 ug/min | INTRAVENOUS | Status: DC
  Filled 2021-07-12 (×2): qty 250

## 2021-07-12 MED ORDER — PHENYLEPHRINE 40 MCG/ML (10ML) SYRINGE FOR IV PUSH (FOR BLOOD PRESSURE SUPPORT)
PREFILLED_SYRINGE | INTRAVENOUS | Status: DC | PRN
  Administered 2021-07-12 (×2): 80 ug via INTRAVENOUS

## 2021-07-12 MED ORDER — SUCCINYLCHOLINE CHLORIDE 200 MG/10ML IV SOSY
PREFILLED_SYRINGE | INTRAVENOUS | Status: DC | PRN
  Administered 2021-07-12: 140 mg via INTRAVENOUS

## 2021-07-12 MED ORDER — MIDAZOLAM HCL 5 MG/5ML IJ SOLN
INTRAMUSCULAR | Status: DC | PRN
  Administered 2021-07-12: 2 mg via INTRAVENOUS
  Administered 2021-07-12: 4 mg via INTRAVENOUS

## 2021-07-12 MED ORDER — VERAPAMIL HCL 2.5 MG/ML IV SOLN
INTRAVENOUS | Status: AC
  Filled 2021-07-12: qty 2

## 2021-07-12 MED ORDER — VERAPAMIL HCL 2.5 MG/ML IV SOLN
INTRAVENOUS | Status: DC | PRN
  Administered 2021-07-12: 10 mL via INTRA_ARTERIAL

## 2021-07-12 MED ORDER — DIPHENHYDRAMINE HCL 50 MG/ML IJ SOLN
INTRAMUSCULAR | Status: AC
  Filled 2021-07-12: qty 1

## 2021-07-12 MED ORDER — MAGNESIUM SULFATE 50 % IJ SOLN
40.0000 meq | INTRAMUSCULAR | Status: DC
  Filled 2021-07-12: qty 9.85

## 2021-07-12 MED ORDER — HEPARIN SODIUM (PORCINE) 1000 UNIT/ML IJ SOLN
INTRAMUSCULAR | Status: DC | PRN
  Administered 2021-07-12: 2000 [IU] via INTRAVENOUS
  Administered 2021-07-12: 26000 [IU] via INTRAVENOUS

## 2021-07-12 MED ORDER — DIPHENHYDRAMINE HCL 50 MG/ML IJ SOLN
INTRAMUSCULAR | Status: DC | PRN
  Administered 2021-07-12: 25 mg via INTRAVENOUS

## 2021-07-12 MED ORDER — FENTANYL CITRATE (PF) 250 MCG/5ML IJ SOLN
INTRAMUSCULAR | Status: DC | PRN
  Administered 2021-07-12 (×3): 150 ug via INTRAVENOUS
  Administered 2021-07-12: 300 ug via INTRAVENOUS

## 2021-07-12 MED ORDER — EPINEPHRINE HCL 5 MG/250ML IV SOLN IN NS
0.0000 ug/min | INTRAVENOUS | Status: AC
  Administered 2021-07-12: 3 ug/min via INTRAVENOUS
  Filled 2021-07-12: qty 250

## 2021-07-12 MED ORDER — LEVOFLOXACIN IN D5W 500 MG/100ML IV SOLN
500.0000 mg | INTRAVENOUS | Status: AC
  Administered 2021-07-12 – 2021-07-13 (×2): 500 mg via INTRAVENOUS
  Filled 2021-07-12 (×2): qty 100

## 2021-07-12 MED ORDER — POTASSIUM CHLORIDE 2 MEQ/ML IV SOLN
80.0000 meq | INTRAVENOUS | Status: DC
  Filled 2021-07-12: qty 40

## 2021-07-12 MED ORDER — FAMOTIDINE IN NACL 20-0.9 MG/50ML-% IV SOLN
INTRAVENOUS | Status: AC
  Filled 2021-07-12: qty 50

## 2021-07-12 MED ORDER — TRANEXAMIC ACID (OHS) BOLUS VIA INFUSION
15.0000 mg/kg | INTRAVENOUS | Status: AC
  Administered 2021-07-12: 1204.5 mg via INTRAVENOUS
  Filled 2021-07-12: qty 1205

## 2021-07-12 MED ORDER — HEPARIN SODIUM (PORCINE) 1000 UNIT/ML IJ SOLN
INTRAMUSCULAR | Status: DC | PRN
  Administered 2021-07-12: 4000 [IU] via INTRAVENOUS

## 2021-07-12 MED ORDER — HEMOSTATIC AGENTS (NO CHARGE) OPTIME
TOPICAL | Status: DC | PRN
Start: 2021-07-12 — End: 2021-07-13
  Administered 2021-07-12 – 2021-07-13 (×3): 1 via TOPICAL

## 2021-07-12 MED ORDER — SODIUM CHLORIDE (PF) 0.9 % IJ SOLN
OROMUCOSAL | Status: DC | PRN
  Administered 2021-07-12 (×4): 4 mL via TOPICAL

## 2021-07-12 MED ORDER — FAMOTIDINE IN NACL 20-0.9 MG/50ML-% IV SOLN
INTRAVENOUS | Status: AC | PRN
  Administered 2021-07-12: 20 mg via INTRAVENOUS

## 2021-07-12 MED ORDER — LACTATED RINGERS IV SOLN: INTRAVENOUS | Status: DC | PRN

## 2021-07-12 MED ORDER — LIDOCAINE HCL (PF) 1 % IJ SOLN
INTRAMUSCULAR | Status: DC | PRN
  Administered 2021-07-12: 2 mL
  Administered 2021-07-12: 10 mL

## 2021-07-12 MED ORDER — FENTANYL CITRATE (PF) 100 MCG/2ML IJ SOLN
INTRAMUSCULAR | Status: AC
  Filled 2021-07-12: qty 2

## 2021-07-12 MED ORDER — PROPOFOL 10 MG/ML IV BOLUS
INTRAVENOUS | Status: AC
  Filled 2021-07-12: qty 20

## 2021-07-12 MED ORDER — MIDAZOLAM HCL (PF) 10 MG/2ML IJ SOLN
INTRAMUSCULAR | Status: AC
  Filled 2021-07-12: qty 2

## 2021-07-12 MED ORDER — PHENYLEPHRINE HCL-NACL 20-0.9 MG/250ML-% IV SOLN
30.0000 ug/min | INTRAVENOUS | Status: AC
  Administered 2021-07-12: 30 ug/min via INTRAVENOUS
  Filled 2021-07-12: qty 250

## 2021-07-12 MED ORDER — DEXMEDETOMIDINE HCL IN NACL 400 MCG/100ML IV SOLN
0.1000 ug/kg/h | INTRAVENOUS | Status: AC
  Administered 2021-07-12: .2 ug/kg/h via INTRAVENOUS
  Administered 2021-07-13: .7 ug/kg/h via INTRAVENOUS
  Filled 2021-07-12 (×2): qty 100

## 2021-07-12 MED ORDER — MILRINONE LACTATE IN DEXTROSE 20-5 MG/100ML-% IV SOLN
INTRAVENOUS | Status: DC | PRN
  Administered 2021-07-12: .375 ug/kg/min via INTRAVENOUS

## 2021-07-12 MED ORDER — ROCURONIUM BROMIDE 10 MG/ML (PF) SYRINGE
PREFILLED_SYRINGE | INTRAVENOUS | Status: DC | PRN
  Administered 2021-07-12: 20 mg via INTRAVENOUS
  Administered 2021-07-12 (×2): 50 mg via INTRAVENOUS
  Administered 2021-07-12: 30 mg via INTRAVENOUS
  Administered 2021-07-12 – 2021-07-13 (×4): 50 mg via INTRAVENOUS

## 2021-07-12 MED ORDER — HEPARIN (PORCINE) IN NACL 1000-0.9 UT/500ML-% IV SOLN
INTRAVENOUS | Status: AC
  Filled 2021-07-12: qty 1000

## 2021-07-12 MED ORDER — EPHEDRINE SULFATE-NACL 50-0.9 MG/10ML-% IV SOSY
PREFILLED_SYRINGE | INTRAVENOUS | Status: DC | PRN
Start: 2021-07-12 — End: 2021-07-13
  Administered 2021-07-12: 15 mg via INTRAVENOUS

## 2021-07-12 SURGICAL SUPPLY — 109 items
ADAPTER CARDIO PERF ANTE/RETRO (ADAPTER) ×4 IMPLANT
BAG DECANTER FOR FLEXI CONT (MISCELLANEOUS) ×4 IMPLANT
BANDAGE ESMARK 6X9 LF (GAUZE/BANDAGES/DRESSINGS) ×3 IMPLANT
BLADE CLIPPER SURG (BLADE) ×4 IMPLANT
BLADE STERNUM SYSTEM 6 (BLADE) ×4 IMPLANT
BNDG ELASTIC 4X5.8 VLCR STR LF (GAUZE/BANDAGES/DRESSINGS) ×4 IMPLANT
BNDG ELASTIC 6X5.8 VLCR STR LF (GAUZE/BANDAGES/DRESSINGS) ×4 IMPLANT
BNDG ESMARK 6X9 LF (GAUZE/BANDAGES/DRESSINGS) ×4
BNDG GAUZE ELAST 4 BULKY (GAUZE/BANDAGES/DRESSINGS) ×4 IMPLANT
CABLE PACING FASLOC BLUE (MISCELLANEOUS) ×4 IMPLANT
CANISTER SUCT 3000ML PPV (MISCELLANEOUS) ×4 IMPLANT
CANNULA AORTIC ROOT 9FR (CANNULA) ×4 IMPLANT
CANNULA EZ GLIDE AORTIC 21FR (CANNULA) ×4 IMPLANT
CATH ACCU-VU SIZ PIG 5F 100CM (CATHETERS) ×4 IMPLANT
CATH ANGIO 5F PIGTAIL 65CM (CATHETERS) ×4 IMPLANT
CATH CPB KIT HENDRICKSON (MISCELLANEOUS) ×4 IMPLANT
CATH DIAG EXPO 6F AL1 (CATHETERS) ×4 IMPLANT
CATH DIAG EXPO 6F FR4 (CATHETERS) ×4 IMPLANT
CATH INFINITI 6F MPB2 (CATHETERS) ×4 IMPLANT
CATH ROBINSON RED A/P 18FR (CATHETERS) ×4 IMPLANT
CATH THORACIC 36FR (CATHETERS) ×4 IMPLANT
CATH THORACIC 36FR RT ANG (CATHETERS) ×4 IMPLANT
CLIP TI WIDE RED SMALL 24 (CLIP) ×4 IMPLANT
CLIP VESOCCLUDE MED 24/CT (CLIP) IMPLANT
CLIP VESOCCLUDE SM WIDE 24/CT (CLIP) IMPLANT
CONTAINER PROTECT SURGISLUSH (MISCELLANEOUS) ×4 IMPLANT
DRAPE CARDIOVASCULAR INCISE (DRAPES) ×1
DRAPE SRG 135X102X78XABS (DRAPES) ×3 IMPLANT
DRAPE WARM FLUID 44X44 (DRAPES) ×4 IMPLANT
DRSG COVADERM 4X14 (GAUZE/BANDAGES/DRESSINGS) ×4 IMPLANT
ELECT REM PT RETURN 9FT ADLT (ELECTROSURGICAL) ×8
ELECTRODE REM PT RTRN 9FT ADLT (ELECTROSURGICAL) ×6 IMPLANT
FELT TEFLON 1X6 (MISCELLANEOUS) ×4 IMPLANT
GAUZE 4X4 16PLY ~~LOC~~+RFID DBL (SPONGE) ×4 IMPLANT
GAUZE SPONGE 4X4 12PLY STRL (GAUZE/BANDAGES/DRESSINGS) ×8 IMPLANT
GLOVE SURG POLYISO LF SZ6 (GLOVE) ×4 IMPLANT
GLOVE SURG SIGNA 7.5 PF LTX (GLOVE) ×12 IMPLANT
GLOVE SURG UNDER POLY LF SZ6 (GLOVE) ×4 IMPLANT
GLOVE SURG UNDER POLY LF SZ6.5 (GLOVE) ×12 IMPLANT
GOWN STRL REUS W/ TWL LRG LVL3 (GOWN DISPOSABLE) ×12 IMPLANT
GOWN STRL REUS W/ TWL XL LVL3 (GOWN DISPOSABLE) ×18 IMPLANT
GOWN STRL REUS W/TWL LRG LVL3 (GOWN DISPOSABLE) ×4
GOWN STRL REUS W/TWL XL LVL3 (GOWN DISPOSABLE) ×6
GRAFT GELWEAVE IMPREG 10X30CM (Prosthesis & Implant Heart) ×4 IMPLANT
HEMOSTAT POWDER SURGIFOAM 1G (HEMOSTASIS) ×12 IMPLANT
HEMOSTAT SURGICEL 2X14 (HEMOSTASIS) ×4 IMPLANT
INSERT FOGARTY SM (MISCELLANEOUS) ×8 IMPLANT
INSERT FOGARTY XLG (MISCELLANEOUS) IMPLANT
IV ADAPTER SYR DOUBLE MALE LL (MISCELLANEOUS) ×4 IMPLANT
KIT BASIN OR (CUSTOM PROCEDURE TRAY) ×4 IMPLANT
KIT SUCTION CATH 14FR (SUCTIONS) ×8 IMPLANT
KIT TURNOVER KIT B (KITS) ×4 IMPLANT
KIT VASOVIEW HEMOPRO 2 VH 4000 (KITS) ×4 IMPLANT
LEAD PACING MYOCARDI (MISCELLANEOUS) ×24 IMPLANT
MARKER GRAFT CORONARY BYPASS (MISCELLANEOUS) ×12 IMPLANT
NS IRRIG 1000ML POUR BTL (IV SOLUTION) ×20 IMPLANT
PACK E OPEN HEART (SUTURE) ×4 IMPLANT
PACK OPEN HEART (CUSTOM PROCEDURE TRAY) ×4 IMPLANT
PAD ARMBOARD 7.5X6 YLW CONV (MISCELLANEOUS) ×8 IMPLANT
PAD ELECT DEFIB RADIOL ZOLL (MISCELLANEOUS) ×4 IMPLANT
PENCIL BUTTON HOLSTER BLD 10FT (ELECTRODE) ×4 IMPLANT
POSITIONER HEAD DONUT 9IN (MISCELLANEOUS) ×4 IMPLANT
PUMP SET IMPELLA 5.5 US (CATHETERS) ×4 IMPLANT
PUNCH AORTIC ROTATE 4.0MM (MISCELLANEOUS) IMPLANT
PUNCH AORTIC ROTATE 4.5MM 8IN (MISCELLANEOUS) IMPLANT
PUNCH AORTIC ROTATE 5MM 8IN (MISCELLANEOUS) IMPLANT
SEALANT SURG COSEAL 8ML (VASCULAR PRODUCTS) ×4 IMPLANT
SET IMPELLA CP PUMP (CATHETERS) ×4 IMPLANT
SET MPS 3-ND DEL (MISCELLANEOUS) ×4 IMPLANT
SPONGE T-LAP 18X18 ~~LOC~~+RFID (SPONGE) ×24 IMPLANT
SPONGE T-LAP 4X18 ~~LOC~~+RFID (SPONGE) ×4 IMPLANT
SUPPORT HEART JANKE-BARRON (MISCELLANEOUS) ×4 IMPLANT
SUT BONE WAX W31G (SUTURE) ×4 IMPLANT
SUT MNCRL AB 4-0 PS2 18 (SUTURE) ×4 IMPLANT
SUT PROLENE 2 0 MH 48 (SUTURE) ×8 IMPLANT
SUT PROLENE 3 0 SH DA (SUTURE) ×4 IMPLANT
SUT PROLENE 4 0 RB 1 (SUTURE) ×8
SUT PROLENE 4 0 SH DA (SUTURE) ×20 IMPLANT
SUT PROLENE 4-0 RB1 .5 CRCL 36 (SUTURE) ×24 IMPLANT
SUT PROLENE 5 0 C 1 36 (SUTURE) ×68 IMPLANT
SUT PROLENE 6 0 C 1 30 (SUTURE) ×28 IMPLANT
SUT PROLENE 7 0 BV 1 (SUTURE) ×8 IMPLANT
SUT PROLENE 7 0 BV1 MDA (SUTURE) ×12 IMPLANT
SUT PROLENE 8 0 BV175 6 (SUTURE) ×16 IMPLANT
SUT SILK  1 MH (SUTURE) ×3
SUT SILK 1 MH (SUTURE) ×9 IMPLANT
SUT SILK 2 0 SH CR/8 (SUTURE) ×4 IMPLANT
SUT STEEL 6MS V (SUTURE) ×4 IMPLANT
SUT STEEL STERNAL CCS#1 18IN (SUTURE) IMPLANT
SUT STEEL SZ 6 DBL 3X14 BALL (SUTURE) ×4 IMPLANT
SUT TEM PAC WIRE 2 0 SH (SUTURE) ×4 IMPLANT
SUT VIC AB 1 CTX 36 (SUTURE) ×2
SUT VIC AB 1 CTX36XBRD ANBCTR (SUTURE) ×6 IMPLANT
SUT VIC AB 2-0 CT1 27 (SUTURE) ×1
SUT VIC AB 2-0 CT1 TAPERPNT 27 (SUTURE) ×3 IMPLANT
SUT VIC AB 2-0 CTX 27 (SUTURE) IMPLANT
SUT VIC AB 3-0 SH 27 (SUTURE)
SUT VIC AB 3-0 SH 27X BRD (SUTURE) IMPLANT
SUT VIC AB 3-0 X1 27 (SUTURE) IMPLANT
SUT VICRYL 4-0 PS2 18IN ABS (SUTURE) IMPLANT
SYSTEM SAHARA CHEST DRAIN ATS (WOUND CARE) ×4 IMPLANT
TAPE PAPER 3X10 WHT MICROPORE (GAUZE/BANDAGES/DRESSINGS) ×4 IMPLANT
TOWEL GREEN STERILE (TOWEL DISPOSABLE) ×4 IMPLANT
TOWEL GREEN STERILE FF (TOWEL DISPOSABLE) ×4 IMPLANT
TRAY FOLEY SLVR 16FR TEMP STAT (SET/KITS/TRAYS/PACK) ×4 IMPLANT
TUBING LAP HI FLOW INSUFFLATIO (TUBING) ×4 IMPLANT
UNDERPAD 30X36 HEAVY ABSORB (UNDERPADS AND DIAPERS) ×4 IMPLANT
WATER STERILE IRR 1000ML POUR (IV SOLUTION) ×8 IMPLANT
WIRE EMERALD 3MM-J .035X260CM (WIRE) ×12 IMPLANT

## 2021-07-12 SURGICAL SUPPLY — 22 items
CATH INFINITI 5F PIG 125CM (CATHETERS) ×2 IMPLANT
CATH INFINITI 5FR ANG PIGTAIL (CATHETERS) ×2 IMPLANT
CATH INFINITI JR4 5F (CATHETERS) ×2 IMPLANT
CATH LAUNCHER 6FR EBU3.5 (CATHETERS) ×2 IMPLANT
CATH OPTITORQUE TIG 4.0 5F (CATHETERS) ×2 IMPLANT
CATH SWAN GANZ VIP 7.5F (CATHETERS) ×2 IMPLANT
ELECT DEFIB PAD ADLT CADENCE (PAD) ×2 IMPLANT
GLIDESHEATH SLEND A-KIT 6F 22G (SHEATH) ×2 IMPLANT
GUIDEWIRE INQWIRE 1.5J.035X260 (WIRE) ×1 IMPLANT
INQWIRE 1.5J .035X260CM (WIRE) ×2
KIT ENCORE 26 ADVANTAGE (KITS) ×2 IMPLANT
KIT HEART LEFT (KITS) ×2 IMPLANT
KIT HEMO VALVE WATCHDOG (MISCELLANEOUS) ×2 IMPLANT
KIT MICROPUNCTURE NIT STIFF (SHEATH) ×2 IMPLANT
PACK CARDIAC CATHETERIZATION (CUSTOM PROCEDURE TRAY) ×2 IMPLANT
SHEATH PINNACLE 6F 10CM (SHEATH) ×2 IMPLANT
SHEATH PINNACLE 8F 10CM (SHEATH) ×2 IMPLANT
SHEATH PROBE COVER 6X72 (BAG) ×2 IMPLANT
TRANSDUCER W/STOPCOCK (MISCELLANEOUS) ×2 IMPLANT
TUBING CIL FLEX 10 FLL-RA (TUBING) ×2 IMPLANT
WIRE EMERALD 3MM-J .035X150CM (WIRE) ×2 IMPLANT
WIRE HI TORQ VERSACORE-J 145CM (WIRE) ×2 IMPLANT

## 2021-07-12 NOTE — Anesthesia Preprocedure Evaluation (Addendum)
Anesthesia Evaluation  Patient identified by MRN, date of birth, ID band Patient awake    Reviewed: Allergy & Precautions, NPO status , Patient's Chart, lab work & pertinent test results  History of Anesthesia Complications Negative for: history of anesthetic complications  Airway Mallampati: III  TM Distance: >3 FB Neck ROM: Full    Dental  (+) Edentulous Upper, Edentulous Lower, Dental Advisory Given   Pulmonary shortness of breath, COPD, former smoker,     + wheezing      Cardiovascular hypertension, Pt. on medications and Pt. on home beta blockers + CAD, + Past MI, + Peripheral Vascular Disease and +CHF  + Valvular Problems/Murmurs  Rhythm:Regular  2015 tte Left ventricle: The cavity size was mildly dilated. The estimated  ejection fraction was 20%. Diffuse hypokinesis. Doppler  parameters are consistent with abnormal left ventricular  relaxation (grade 1 diastolic dysfunction).  - Aortic valve: Trileaflet; mildly calcified leaflets. There was no  stenosis.  - Mitral valve: Mildly calcified annulus. There was trivial  regurgitation. Valve area by continuity equation (using LVOT  flow): 2.69 cm^2.  - Left atrium: The atrium was mildly dilated.  - Right ventricle: The cavity size was normal. Systolic function  was mildly reduced.  - Tricuspid valve: Peak RV-RA gradient (S): 39 mm Hg.  - Pulmonary arteries: PA peak pressure: 42 mm Hg (S).  - Inferior vena cava: The vessel was normal in size. The  respirophasic diameter changes were in the normal range (>= 50%),  consistent with normal central venous pressure.    LM: Normal LAD: Prox LAD/D1 95% severely calcified lesion         Distal LAD either has diffuse spasm, diffuse disease, or is simply under-filled Lcx: Diminutive vessel with no significant disease RCA: Super-dominant vessel          Mid focal 60% eccentric lesion          RPDA 30%  disease  RA: 15 mmHg RV: 55/14 mmHg PA: 56/26 mmHg, mPAP 45 mmHg PCW: 39 mmHg  CO: 3.4 L/min CI: 1.8 L/min/m2    Neuro/Psych negative neurological ROS  negative psych ROS   GI/Hepatic negative GI ROS, Neg liver ROS,   Endo/Other  negative endocrine ROSNo results found for: HGBA1C   Renal/GU Renal InsufficiencyRenal diseaseLab Results      Component                Value               Date                      CREATININE               1.50 (H)            07/30/2021                Musculoskeletal negative musculoskeletal ROS (+)   Abdominal   Peds  Hematology  (+) Blood dyscrasia, anemia , Lab Results      Component                Value               Date                      WBC                      14.8 (H)  08/09/2021                HGB                      12.9 (L)            08/08/2021                HCT                      38.0 (L)            07/26/2021                MCV                      103.7 (H)           07/29/2021                PLT                      159                 07/23/2021              Anesthesia Other Findings S/p AAA repair  Reproductive/Obstetrics                            Anesthesia Physical Anesthesia Plan  ASA: 4 and emergent  Anesthesia Plan: General   Post-op Pain Management:    Induction: Intravenous, Rapid sequence and Cricoid pressure planned  PONV Risk Score and Plan: 2  Airway Management Planned: Oral ETT  Additional Equipment: Arterial line, CVP, PA Cath, TEE and Ultrasound Guidance Line Placement  Intra-op Plan:   Post-operative Plan: Post-operative intubation/ventilation  Informed Consent: I have reviewed the patients History and Physical, chart, labs and discussed the procedure including the risks, benefits and alternatives for the proposed anesthesia with the patient or authorized representative who has indicated his/her understanding and acceptance.     Dental advisory  given  Plan Discussed with: CRNA, Anesthesiologist and Surgeon  Anesthesia Plan Comments:         Anesthesia Quick Evaluation

## 2021-07-12 NOTE — Anesthesia Procedure Notes (Signed)
Central Venous Catheter Insertion Performed by: Oleta Mouse, MD, anesthesiologist Start/End09/28/2022 5:07 PM, 07/19/2021 5:14 PM Patient location: OR. Preanesthetic checklist: patient identified, IV checked, site marked, risks and benefits discussed, surgical consent, monitors and equipment checked, pre-op evaluation, timeout performed and anesthesia consent Position: supine Hand hygiene performed  and maximum sterile barriers used  Catheter size: 9 Fr Sheath introducer Procedure performed using ultrasound guided technique. Ultrasound Notes:anatomy identified, needle tip was noted to be adjacent to the nerve/plexus identified, no ultrasound evidence of intravascular and/or intraneural injection and image(s) printed for medical record Attempts: 1 Following insertion, line sutured, dressing applied and Biopatch. Post procedure assessment: blood return through all ports, free fluid flow and no air  Patient tolerated the procedure well with no immediate complications.

## 2021-07-12 NOTE — Progress Notes (Signed)
  Echocardiogram Echocardiogram Transesophageal has been performed.  Robert Boyer 08/05/2021, 5:38 PM

## 2021-07-12 NOTE — Progress Notes (Signed)
CRITICAL VALUE STICKER  CRITICAL VALUE: troponin 6,839  RECEIVER (on-site recipient of call):  DATE & TIME NOTIFIED: [id number]  [place] (representative from lab):  MD NOTIFIED: Called OR - message relayed to [place]: [phone]  RESPONSE: OR RN to relay message to Psychologist, sport and exercise and anesthesiologist

## 2021-07-12 NOTE — Consult Note (Signed)
Reason for Consult:2 vessel CAD, STEMI Referring Physician: Dr. Aris Lot Nunnery is an 75 y.o. male.  HPI: 75 yo man presented with CP  75 yo man history of tobacco abuse, PAD, aorto-bifem, CHF with reduced EF, RUL nodule and contrast sensitivity presents with waxing and waning CP since last night. Worsened this AM. Called EMS, EKG showed anterolateral STEMI. Taken emergently to cath lab. Pain resolved on arrival. Cath showed complex calcified proximal LAD lesion and mid RCA stenosis.LVEDP= 40.   Unable to place Impella for support for PCI due to PAD.   Currently pain has eased dramatically but still has some tightness.   Past Medical History:  Diagnosis Date   CHF (congestive heart failure) (HCC)    COPD (chronic obstructive pulmonary disease) (HCC)    Dyspnea 12/30/2018   Emphysema/COPD (HCC)    Gout    Hypertension    Shortness of breath     Past Surgical History:  Procedure Laterality Date   ABDOMINAL SURGERY     ARTERIAL BYPASS SURGRY     CARDIAC CATHETERIZATION  2009    Family History  Problem Relation Age of Onset   Cancer Mother    Arthritis Other    Cancer Other    Heart attack Father     Social History:  reports that he quit smoking about 13 years ago. His smoking use included cigarettes. He has a 64.00 pack-year smoking history. He has never used smokeless tobacco. He reports current alcohol use. He reports that he does not use drugs.  Allergies:  Allergies  Allergen Reactions   Penicillin G Anaphylaxis   Enalapril Cough   Iodinated Diagnostic Agents Hives    Pt developed itching and then hives on back after CT scan 05/24/21   Lisinopril Cough   Penicillins Rash    Has patient had a PCN reaction causing immediate rash, facial/tongue/throat swelling, SOB or lightheadedness with hypotension: Yes Has patient had a PCN reaction causing severe rash involving mucus membranes or skin necrosis: Yes Has patient had a PCN reaction that required  hospitalization No Has patient had a PCN reaction occurring within the last 10 years: No If all of the above answers are "NO", then may proceed with Cephalosporin use.    Medications: Prior to Admission:  Medications Prior to Admission  Medication Sig Dispense Refill Last Dose   acetaminophen (TYLENOL) 325 MG tablet Take 650 mg by mouth every 6 (six) hours as needed. 2 tablets as needed for pain.      allopurinol (ZYLOPRIM) 100 MG tablet Take 100 mg by mouth daily. Take 2 tablets by mouth in the morning      ascorbic acid (VITAMIN C) 500 MG tablet Take 500 mg by mouth 2 (two) times daily. Take one tablet by mouth twice daily      aspirin EC 81 MG tablet Take 81 mg by mouth daily.      calcium carbonate (TUMS - DOSED IN MG ELEMENTAL CALCIUM) 500 MG chewable tablet Chew 1 tablet by mouth as needed.       carvedilol (COREG) 25 MG tablet Take 0.5 tablets (12.5 mg total) by mouth 2 (two) times daily with a meal. 60 tablet 0    Cholecalciferol 50 MCG (2000 UT) TABS Take 2,000 Units by mouth every other day. Take 1 tablet by mouth daily. (Patient not taking: Reported on 05/27/2021)      furosemide (LASIX) 20 MG tablet Take 1 tablet (20 mg total) by mouth daily. 30 tablet 0  losartan (COZAAR) 25 MG tablet Take 25 mg by mouth daily.      methimazole (TAPAZOLE) 10 MG tablet Take 10 mg by mouth daily. Take one tablet by mouth daily.      Oxcarbazepine (TRILEPTAL) 300 MG tablet Take 300 mg by mouth 2 (two) times daily. Take one half tablet by mouth once a day and take one tablet at bedtime for nerve pain.      predniSONE (DELTASONE) 50 MG tablet Take one po 13 hours prior to CT, one po 7 hours prior to CT, and 1 hour prior to CT. Please also take Benadryl 50 mg (OTC) one hour prior to CT. 3 tablet 5    simvastatin (ZOCOR) 40 MG tablet Take 20 mg by mouth daily.      tiotropium (SPIRIVA) 18 MCG inhalation capsule Place 1.25 mcg into inhaler and inhale as needed. Inhale 2 puffs by mouth once a day.  (Patient  not taking: No sig reported)      triamcinolone cream (KENALOG) 0.1 % Apply 1 application topically 2 (two) times daily as needed (for ezcema).       Results for orders placed or performed during the hospital encounter of 07/19/2021 (from the past 48 hour(s))  Resp Panel by RT-PCR (Flu A&B, Covid) Nasopharyngeal Swab     Status: None   Collection Time: 07/21/2021  1:55 PM   Specimen: Nasopharyngeal Swab; Nasopharyngeal(NP) swabs in vial transport medium  Result Value Ref Range   SARS Coronavirus 2 by RT PCR NEGATIVE NEGATIVE    Comment: (NOTE) SARS-CoV-2 target nucleic acids are NOT DETECTED.  The SARS-CoV-2 RNA is generally detectable in upper respiratory specimens during the acute phase of infection. The lowest concentration of SARS-CoV-2 viral copies this assay can detect is 138 copies/mL. A negative result does not preclude SARS-Cov-2 infection and should not be used as the sole basis for treatment or other patient management decisions. A negative result may occur with  improper specimen collection/handling, submission of specimen other than nasopharyngeal swab, presence of viral mutation(s) within the areas targeted by this assay, and inadequate number of viral copies(<138 copies/mL). A negative result must be combined with clinical observations, patient history, and epidemiological information. The expected result is Negative.  Fact Sheet for Patients:  EntrepreneurPulse.com.au  Fact Sheet for Healthcare Providers:  IncredibleEmployment.be  This test is no t yet approved or cleared by the [place] FDA and  has been authorized for detection and/or diagnosis of SARS-CoV-2 by FDA under an Emergency Use Authorization (EUA). This EUA will remain  in effect (meaning this test can be used) for the duration of the COVID-19 declaration under Section 564(b)(1) of the Act, 21 U.S.C.section 360bbb-3(b)(1), unless the authorization is terminated  or  revoked sooner.       Influenza A by PCR NEGATIVE NEGATIVE   Influenza B by PCR NEGATIVE NEGATIVE    Comment: (NOTE) The Xpert Xpress SARS-CoV-2/FLU/RSV plus assay is intended as an aid in the diagnosis of influenza from Nasopharyngeal swab specimens and should not be used as a sole basis for treatment. Nasal washings and aspirates are unacceptable for Xpert Xpress SARS-CoV-2/FLU/RSV testing.  Fact Sheet for Patients: EntrepreneurPulse.com.au  Fact Sheet for Healthcare Providers: IncredibleEmployment.be  This test is not yet approved or cleared by the [place] FDA and has been authorized for detection and/or diagnosis of SARS-CoV-2 by FDA under an Emergency Use Authorization (EUA). This EUA will remain in effect (meaning this test can be used) for the duration of the COVID-19  declaration under Section 564(b)(1) of the Act, 21 U.S.C. section 360bbb-3(b)(1), unless the authorization is terminated or revoked.  Performed at [facility], [place] [address] [place], [place] [address]   CBC with Differential/Platelet     Status: Abnormal   Collection Time: 07/14/2021  2:10 PM  Result Value Ref Range   WBC 14.8 (H) 4.0 - 10.5 K/uL   RBC 3.53 (L) 4.22 - 5.81 MIL/uL   Hemoglobin 12.1 (L) 13.0 - 17.0 g/dL   HCT 36.6 (L) 39.0 - 52.0 %   MCV 103.7 (H) 80.0 - 100.0 fL   MCH 34.3 (H) 26.0 - 34.0 pg   MCHC 33.1 30.0 - 36.0 g/dL   RDW 14.8 11.5 - 15.5 %   Platelets 159 150 - 400 K/uL   nRBC 0.0 0.0 - 0.2 %   Neutrophils Relative % 87 %   Neutro Abs 12.6 (H) 1.7 - 7.7 K/uL   Lymphocytes Relative 7 %   Lymphs Abs 1.1 0.7 - 4.0 K/uL   Monocytes Relative 6 %   Monocytes Absolute 0.9 0.1 - 1.0 K/uL   Eosinophils Relative 0 %   Eosinophils Absolute 0.0 0.0 - 0.5 K/uL   Basophils Relative 0 %   Basophils Absolute 0.0 0.0 - 0.1 K/uL   Immature Granulocytes 0 %   Abs Immature Granulocytes 0.06 0.00 - 0.07 K/uL    Comment: Performed at [facility], [address] [place], [place] [address]  Type and screen Pre-op diagnosis: STEMI     Status: None (Preliminary result)   Collection Time: 07/27/2021  3:30 PM  Result Value Ref Range   ABO/RH(D) A POS    Antibody Screen PENDING    Sample Expiration      07/15/2021,2359 Performed at [facility], [place] [address], [place], [place] [address]     No results found.  Review of Systems Height 5\' 6"  (1.676 m), weight 80.3 kg, SpO2 98 %. Physical Exam Vitals reviewed.  Constitutional:      Appearance: Normal appearance. He is ill-appearing. He is not diaphoretic.  HENT:     Head: Normocephalic and atraumatic.  Eyes:     General: No scleral icterus.    Extraocular Movements: Extraocular movements intact.  Neck:     Vascular: No carotid bruit.  Cardiovascular:     Rate and Rhythm: Normal rate and regular rhythm.     Heart sounds: No murmur heard.    Comments: No palpable DP or PT pulses Pulmonary:     Effort: Pulmonary effort is normal. No respiratory distress.     Breath sounds: No wheezing or rales.  Abdominal:     General: There is no distension.     Palpations: Abdomen is soft.     Tenderness: There is no abdominal tenderness.  Musculoskeletal:     Cervical back: Normal range of motion.  Lymphadenopathy:     Cervical: No cervical adenopathy.  Skin:    Comments: Cool and dry  Neurological:     Mental Status: He is alert.    Assessment/Plan: 75 yo man with multiple CRF presents with Cp. ECG showed anterolateral ST elevation. Cath shows complex calcified proximal LAD lesion and mid RCA stenosis.  Unable to place Impella for supported PCI. Best option is emergent CABG, with possible Impella placement if needed to wean from bypass.  I discussed the general nature of the procedure, including the need for general anesthesia, the incisions to be used and the use of  cardiopulmonary bypass with Mr. Stehlik.  We discussed the expected hospital stay, overall  recovery and short and long term outcomes.  I informed him of the indications, risks, benefits and alternatives.  He understands the risks include but are not limited to death, stroke, MI, DVT/PE, bleeding, possible need for transfusion, infections, cardiac arrhythmias, as well as other organ system dysfunction including respiratory, renal, or GI complications.   He accepts the risks and agrees to proceed.   Melrose Nakayama 07/15/2021, 4:02 PM

## 2021-07-12 NOTE — H&P (Signed)
Robert Boyer is an 75 y.o. male.   Chief Complaint: Chest pain HPI:   75 year old Caucasian male, former smoker, peripheral vascular disease status post abdominal bypass surgery 2009 (details not known), history of heart failure with reduced ejection fraction with recovered EF 45-50% (2020), h/o contrast sensitivity, COPD, stable RUL nodule and mild thoracic adenopathy (non-hypermtabolic on PET scan 8850), now admitted with chest pain and STEMI.    Patient has been having waxing and waning, left sided chest pain since last night. Pain got worse this morning that prompted call to EMS. EKG showed anterolateral STEMI. His pain resolved en route. He only received Aspirin 325 mg.   Past Medical History:  Diagnosis Date   CHF (congestive heart failure) (HCC)    COPD (chronic obstructive pulmonary disease) (HCC)    Dyspnea 12/30/2018   Emphysema/COPD (HCC)    Gout    Hypertension    Shortness of breath     Past Surgical History:  Procedure Laterality Date   ABDOMINAL SURGERY     ARTERIAL BYPASS SURGRY     CARDIAC CATHETERIZATION  2009     Family History  Problem Relation Age of Onset   Cancer Mother    Arthritis Other    Cancer Other    Heart attack Father     Social History:  reports that he quit smoking about 13 years ago. His smoking use included cigarettes. He has a 64.00 pack-year smoking history. He has never used smokeless tobacco. He reports current alcohol use. He reports that he does not use drugs.  Allergies:  Allergies  Allergen Reactions   Penicillin G Anaphylaxis   Enalapril Cough   Iodinated Diagnostic Agents Hives    Pt developed itching and then hives on back after CT scan 05/24/21   Lisinopril Cough   Penicillins Rash    Has patient had a PCN reaction causing immediate rash, facial/tongue/throat swelling, SOB or lightheadedness with hypotension: Yes Has patient had a PCN reaction causing severe rash involving mucus membranes or skin necrosis: Yes Has  patient had a PCN reaction that required hospitalization No Has patient had a PCN reaction occurring within the last 10 years: No If all of the above answers are "NO", then may proceed with Cephalosporin use.    Review of Systems  Constitutional: Negative for decreased appetite, malaise/fatigue, weight gain and weight loss.  HENT:  Negative for congestion.   Eyes:  Negative for visual disturbance.  Cardiovascular:  Positive for chest pain. Negative for dyspnea on exertion, leg swelling, palpitations and syncope.  Respiratory:  Negative for cough.   Endocrine: Negative for cold intolerance.  Hematologic/Lymphatic: Does not bruise/bleed easily.  Skin:  Negative for itching and rash.  Musculoskeletal:  Negative for myalgias.  Gastrointestinal:  Negative for abdominal pain, nausea and vomiting.  Genitourinary:  Negative for dysuria.  Neurological:  Negative for dizziness and weakness.  Psychiatric/Behavioral:  The patient is not nervous/anxious.   All other systems reviewed and are negative.   There were no vitals taken for this visit. There is no height or weight on file to calculate BMI.  Physical Exam Vitals and nursing note reviewed.  Constitutional:      General: He is not in acute distress.    Appearance: He is well-developed.  HENT:     Head: Normocephalic and atraumatic.  Eyes:     Conjunctiva/sclera: Conjunctivae normal.     Pupils: Pupils are equal, round, and reactive to light.  Neck:     Vascular: No JVD.  Cardiovascular:     Rate and Rhythm: Normal rate and regular rhythm.     Pulses: Intact distal pulses.          Carotid pulses are  on the left side with bruit.      Dorsalis pedis pulses are 0 on the right side and 0 on the left side.       Posterior tibial pulses are 1+ on the right side and 1+ on the left side.     Heart sounds: No murmur heard. Pulmonary:     Effort: Pulmonary effort is normal.     Breath sounds: Normal breath sounds. No wheezing or rales.   Abdominal:     General: Bowel sounds are normal.     Palpations: Abdomen is soft.     Tenderness: There is no rebound.  Musculoskeletal:        General: No tenderness. Normal range of motion.     Right lower leg: No edema.     Left lower leg: No edema.  Lymphadenopathy:     Cervical: No cervical adenopathy.  Skin:    General: Skin is warm and dry.  Neurological:     Mental Status: He is alert and oriented to person, place, and time.     Cranial Nerves: No cranial nerve deficit.    No results found for this or any previous visit (from the past 48 hour(s)).  Labs:   Lab Results  Component Value Date   WBC 10.9 (H) 09/07/2014   HGB 12.9 (L) 09/07/2014   HCT 38.5 (L) 09/07/2014   MCV 93.7 09/07/2014   PLT 347 09/07/2014   No results for input(s): NA, K, CL, CO2, BUN, CREATININE, CALCIUM, PROT, BILITOT, ALKPHOS, ALT, AST, GLUCOSE in the last 168 hours.  Invalid input(s): LABALBU  Lipid Panel  No results found for: CHOL, TRIG, HDL, CHOLHDL, VLDL, LDLCALC  BNP (last 3 results) No results for input(s): BNP in the last 8760 hours.  HEMOGLOBIN A1C No results found for: HGBA1C, MPG  Cardiac Panel (last 3 results) No results for input(s): CKTOTAL, CKMB, RELINDX in the last 8760 hours.  Invalid input(s): TROPONINHS  No results found for: CKTOTAL, CKMB, CKMBINDEX   TSH No results for input(s): TSH in the last 8760 hours.   No medications prior to admission.     No current facility-administered medications for this encounter.  Current Outpatient Medications:    acetaminophen (TYLENOL) 325 MG tablet, Take 650 mg by mouth every 6 (six) hours as needed. 2 tablets as needed for pain., Disp: , Rfl:    allopurinol (ZYLOPRIM) 100 MG tablet, Take 100 mg by mouth daily. Take 2 tablets by mouth in the morning, Disp: , Rfl:    ascorbic acid (VITAMIN C) 500 MG tablet, Take 500 mg by mouth 2 (two) times daily. Take one tablet by mouth twice daily, Disp: , Rfl:    aspirin EC 81 MG  tablet, Take 81 mg by mouth daily., Disp: , Rfl:    calcium carbonate (TUMS - DOSED IN MG ELEMENTAL CALCIUM) 500 MG chewable tablet, Chew 1 tablet by mouth as needed. , Disp: , Rfl:    carvedilol (COREG) 25 MG tablet, Take 0.5 tablets (12.5 mg total) by mouth 2 (two) times daily with a meal., Disp: 60 tablet, Rfl: 0   Cholecalciferol 50 MCG (2000 UT) TABS, Take 2,000 Units by mouth every other day. Take 1 tablet by mouth daily. (Patient not taking: Reported on 05/27/2021), Disp: , Rfl:  furosemide (LASIX) 20 MG tablet, Take 1 tablet (20 mg total) by mouth daily., Disp: 30 tablet, Rfl: 0   losartan (COZAAR) 25 MG tablet, Take 25 mg by mouth daily., Disp: , Rfl:    methimazole (TAPAZOLE) 10 MG tablet, Take 10 mg by mouth daily. Take one tablet by mouth daily., Disp: , Rfl:    Oxcarbazepine (TRILEPTAL) 300 MG tablet, Take 300 mg by mouth 2 (two) times daily. Take one half tablet by mouth once a day and take one tablet at bedtime for nerve pain., Disp: , Rfl:    predniSONE (DELTASONE) 50 MG tablet, Take one po 13 hours prior to CT, one po 7 hours prior to CT, and 1 hour prior to CT. Please also take Benadryl 50 mg (OTC) one hour prior to CT., Disp: 3 tablet, Rfl: 5   simvastatin (ZOCOR) 40 MG tablet, Take 20 mg by mouth daily., Disp: , Rfl:    tiotropium (SPIRIVA) 18 MCG inhalation capsule, Place 1.25 mcg into inhaler and inhale as needed. Inhale 2 puffs by mouth once a day.  (Patient not taking: No sig reported), Disp: , Rfl:    triamcinolone cream (KENALOG) 0.1 %, Apply 1 application topically 2 (two) times daily as needed (for ezcema)., Disp: , Rfl:   BP: 120/80 mmHg HR: 85/min    CARDIAC STUDIES:  EKG 08/04/2021: Sinus rhythm Anterolateral STEMI  Echocardiogram 01/19/2019:  Left ventricle cavity is normal in size. Moderate concentric hypertrophy  of the left ventricle. Mild decrease in global wall motion. Doppler  evidence of grade I (impaired) diastolic dysfunction, normal LAP.  Calculated  EF 46%.  Mild aortic valve stenosis. Aortic valve mean gradient of 9 mmHg, Vmax of  2.0  m/s. Calculated aortic valve area by continuity equation is 1.3 cm.  Mild tricuspid regurgitation.  No evidence of pulmonary hypertension.   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 Mild stenosis in the left external carotid artery (<50%).  Antegrade right vertebral artery flow. Antegrade left vertebral artery  flow.  Follow up in six months is appropriate if clinically indicated.  Assessment/Plan  75 year old Caucasian male, former smoker, peripheral vascular disease status post abdominal bypass surgery 2009 (details not known), history of heart failure with reduced ejection fraction with recovered EF 45-50% (2020), h/o contrast sensitivity, COPD, stable RUL nodule and mild thoracic adenopathy (non-hypermtabolic on PET scan 9379), now admitted with chest pain and STEMI.    STEMI: Currently chest pain free but has anterolateral ST elevation. Will proceed with urgent coronary angiography and possible intervention He is receiving IV solumedrol 125 mg, benadryl 25 mg, pepcid 20 mg.    Nigel Mormon, MD Pager: [phone] Office: [phone]

## 2021-07-13 ENCOUNTER — Inpatient Hospital Stay (HOSPITAL_COMMUNITY): Payer: No Typology Code available for payment source

## 2021-07-13 DIAGNOSIS — I2102 ST elevation (STEMI) myocardial infarction involving left anterior descending coronary artery: Secondary | ICD-10-CM

## 2021-07-13 LAB — BPAM PLATELET PHERESIS
Blood Product Expiration Date: 202209062359
ISSUE DATE / TIME: 202209022304
Unit Type and Rh: 6200

## 2021-07-13 LAB — POCT I-STAT 7, (LYTES, BLD GAS, ICA,H+H)
Acid-Base Excess: 2 mmol/L (ref 0.0–2.0)
Acid-base deficit: 11 mmol/L — ABNORMAL HIGH (ref 0.0–2.0)
Acid-base deficit: 3 mmol/L — ABNORMAL HIGH (ref 0.0–2.0)
Acid-base deficit: 3 mmol/L — ABNORMAL HIGH (ref 0.0–2.0)
Acid-base deficit: 4 mmol/L — ABNORMAL HIGH (ref 0.0–2.0)
Acid-base deficit: 4 mmol/L — ABNORMAL HIGH (ref 0.0–2.0)
Acid-base deficit: 9 mmol/L — ABNORMAL HIGH (ref 0.0–2.0)
Bicarbonate: 16.7 mmol/L — ABNORMAL LOW (ref 20.0–28.0)
Bicarbonate: 18.4 mmol/L — ABNORMAL LOW (ref 20.0–28.0)
Bicarbonate: 20 mmol/L (ref 20.0–28.0)
Bicarbonate: 21.6 mmol/L (ref 20.0–28.0)
Bicarbonate: 21.6 mmol/L (ref 20.0–28.0)
Bicarbonate: 22.8 mmol/L (ref 20.0–28.0)
Bicarbonate: 25.7 mmol/L (ref 20.0–28.0)
Calcium, Ion: 0.61 mmol/L — CL (ref 1.15–1.40)
Calcium, Ion: 0.94 mmol/L — ABNORMAL LOW (ref 1.15–1.40)
Calcium, Ion: 0.94 mmol/L — ABNORMAL LOW (ref 1.15–1.40)
Calcium, Ion: 0.97 mmol/L — ABNORMAL LOW (ref 1.15–1.40)
Calcium, Ion: 1.23 mmol/L (ref 1.15–1.40)
Calcium, Ion: 1.24 mmol/L (ref 1.15–1.40)
Calcium, Ion: 1.25 mmol/L (ref 1.15–1.40)
HCT: 21 % — ABNORMAL LOW (ref 39.0–52.0)
HCT: 22 % — ABNORMAL LOW (ref 39.0–52.0)
HCT: 22 % — ABNORMAL LOW (ref 39.0–52.0)
HCT: 25 % — ABNORMAL LOW (ref 39.0–52.0)
HCT: 26 % — ABNORMAL LOW (ref 39.0–52.0)
HCT: 26 % — ABNORMAL LOW (ref 39.0–52.0)
HCT: 41 % (ref 39.0–52.0)
Hemoglobin: 13.9 g/dL (ref 13.0–17.0)
Hemoglobin: 7.1 g/dL — ABNORMAL LOW (ref 13.0–17.0)
Hemoglobin: 7.5 g/dL — ABNORMAL LOW (ref 13.0–17.0)
Hemoglobin: 7.5 g/dL — ABNORMAL LOW (ref 13.0–17.0)
Hemoglobin: 8.5 g/dL — ABNORMAL LOW (ref 13.0–17.0)
Hemoglobin: 8.8 g/dL — ABNORMAL LOW (ref 13.0–17.0)
Hemoglobin: 8.8 g/dL — ABNORMAL LOW (ref 13.0–17.0)
O2 Saturation: 100 %
O2 Saturation: 100 %
O2 Saturation: 100 %
O2 Saturation: 100 %
O2 Saturation: 100 %
O2 Saturation: 92 %
O2 Saturation: 99 %
Potassium: 5 mmol/L (ref 3.5–5.1)
Potassium: 5.5 mmol/L — ABNORMAL HIGH (ref 3.5–5.1)
Potassium: 5.6 mmol/L — ABNORMAL HIGH (ref 3.5–5.1)
Potassium: 5.8 mmol/L — ABNORMAL HIGH (ref 3.5–5.1)
Potassium: 6 mmol/L — ABNORMAL HIGH (ref 3.5–5.1)
Potassium: 6.2 mmol/L — ABNORMAL HIGH (ref 3.5–5.1)
Potassium: 7 mmol/L (ref 3.5–5.1)
Sodium: 139 mmol/L (ref 135–145)
Sodium: 141 mmol/L (ref 135–145)
Sodium: 141 mmol/L (ref 135–145)
Sodium: 142 mmol/L (ref 135–145)
Sodium: 142 mmol/L (ref 135–145)
Sodium: 142 mmol/L (ref 135–145)
Sodium: 143 mmol/L (ref 135–145)
TCO2: 18 mmol/L — ABNORMAL LOW (ref 22–32)
TCO2: 20 mmol/L — ABNORMAL LOW (ref 22–32)
TCO2: 21 mmol/L — ABNORMAL LOW (ref 22–32)
TCO2: 23 mmol/L (ref 22–32)
TCO2: 23 mmol/L (ref 22–32)
TCO2: 24 mmol/L (ref 22–32)
TCO2: 27 mmol/L (ref 22–32)
pCO2 arterial: 32.9 mmHg (ref 32.0–48.0)
pCO2 arterial: 35.9 mmHg (ref 32.0–48.0)
pCO2 arterial: 36.2 mmHg (ref 32.0–48.0)
pCO2 arterial: 38.1 mmHg (ref 32.0–48.0)
pCO2 arterial: 42 mmHg (ref 32.0–48.0)
pCO2 arterial: 42.4 mmHg (ref 32.0–48.0)
pCO2 arterial: 47.9 mmHg (ref 32.0–48.0)
pH, Arterial: 7.152 — CL (ref 7.350–7.450)
pH, Arterial: 7.245 — ABNORMAL LOW (ref 7.350–7.450)
pH, Arterial: 7.342 — ABNORMAL LOW (ref 7.350–7.450)
pH, Arterial: 7.361 (ref 7.350–7.450)
pH, Arterial: 7.386 (ref 7.350–7.450)
pH, Arterial: 7.392 (ref 7.350–7.450)
pH, Arterial: 7.458 — ABNORMAL HIGH (ref 7.350–7.450)
pO2, Arterial: 192 mmHg — ABNORMAL HIGH (ref 83.0–108.0)
pO2, Arterial: 273 mmHg — ABNORMAL HIGH (ref 83.0–108.0)
pO2, Arterial: 327 mmHg — ABNORMAL HIGH (ref 83.0–108.0)
pO2, Arterial: 341 mmHg — ABNORMAL HIGH (ref 83.0–108.0)
pO2, Arterial: 386 mmHg — ABNORMAL HIGH (ref 83.0–108.0)
pO2, Arterial: 431 mmHg — ABNORMAL HIGH (ref 83.0–108.0)
pO2, Arterial: 82 mmHg — ABNORMAL LOW (ref 83.0–108.0)

## 2021-07-13 LAB — POCT I-STAT, CHEM 8
BUN: 26 mg/dL — ABNORMAL HIGH (ref 8–23)
BUN: 28 mg/dL — ABNORMAL HIGH (ref 8–23)
BUN: 28 mg/dL — ABNORMAL HIGH (ref 8–23)
BUN: 28 mg/dL — ABNORMAL HIGH (ref 8–23)
BUN: 29 mg/dL — ABNORMAL HIGH (ref 8–23)
BUN: 30 mg/dL — ABNORMAL HIGH (ref 8–23)
BUN: 31 mg/dL — ABNORMAL HIGH (ref 8–23)
Calcium, Ion: 0.62 mmol/L — CL (ref 1.15–1.40)
Calcium, Ion: 0.84 mmol/L — CL (ref 1.15–1.40)
Calcium, Ion: 0.98 mmol/L — ABNORMAL LOW (ref 1.15–1.40)
Calcium, Ion: 0.99 mmol/L — ABNORMAL LOW (ref 1.15–1.40)
Calcium, Ion: 1.09 mmol/L — ABNORMAL LOW (ref 1.15–1.40)
Calcium, Ion: 1.23 mmol/L (ref 1.15–1.40)
Calcium, Ion: 1.25 mmol/L (ref 1.15–1.40)
Chloride: 105 mmol/L (ref 98–111)
Chloride: 106 mmol/L (ref 98–111)
Chloride: 106 mmol/L (ref 98–111)
Chloride: 108 mmol/L (ref 98–111)
Chloride: 108 mmol/L (ref 98–111)
Chloride: 110 mmol/L (ref 98–111)
Chloride: 111 mmol/L (ref 98–111)
Creatinine, Ser: 1.3 mg/dL — ABNORMAL HIGH (ref 0.61–1.24)
Creatinine, Ser: 1.4 mg/dL — ABNORMAL HIGH (ref 0.61–1.24)
Creatinine, Ser: 1.4 mg/dL — ABNORMAL HIGH (ref 0.61–1.24)
Creatinine, Ser: 1.4 mg/dL — ABNORMAL HIGH (ref 0.61–1.24)
Creatinine, Ser: 1.4 mg/dL — ABNORMAL HIGH (ref 0.61–1.24)
Creatinine, Ser: 1.4 mg/dL — ABNORMAL HIGH (ref 0.61–1.24)
Creatinine, Ser: 1.6 mg/dL — ABNORMAL HIGH (ref 0.61–1.24)
Glucose, Bld: 134 mg/dL — ABNORMAL HIGH (ref 70–99)
Glucose, Bld: 149 mg/dL — ABNORMAL HIGH (ref 70–99)
Glucose, Bld: 157 mg/dL — ABNORMAL HIGH (ref 70–99)
Glucose, Bld: 161 mg/dL — ABNORMAL HIGH (ref 70–99)
Glucose, Bld: 172 mg/dL — ABNORMAL HIGH (ref 70–99)
Glucose, Bld: 188 mg/dL — ABNORMAL HIGH (ref 70–99)
Glucose, Bld: 269 mg/dL — ABNORMAL HIGH (ref 70–99)
HCT: 20 % — ABNORMAL LOW (ref 39.0–52.0)
HCT: 21 % — ABNORMAL LOW (ref 39.0–52.0)
HCT: 25 % — ABNORMAL LOW (ref 39.0–52.0)
HCT: 25 % — ABNORMAL LOW (ref 39.0–52.0)
HCT: 26 % — ABNORMAL LOW (ref 39.0–52.0)
HCT: 36 % — ABNORMAL LOW (ref 39.0–52.0)
HCT: 38 % — ABNORMAL LOW (ref 39.0–52.0)
Hemoglobin: 12.2 g/dL — ABNORMAL LOW (ref 13.0–17.0)
Hemoglobin: 12.9 g/dL — ABNORMAL LOW (ref 13.0–17.0)
Hemoglobin: 6.8 g/dL — CL (ref 13.0–17.0)
Hemoglobin: 7.1 g/dL — ABNORMAL LOW (ref 13.0–17.0)
Hemoglobin: 8.5 g/dL — ABNORMAL LOW (ref 13.0–17.0)
Hemoglobin: 8.5 g/dL — ABNORMAL LOW (ref 13.0–17.0)
Hemoglobin: 8.8 g/dL — ABNORMAL LOW (ref 13.0–17.0)
Potassium: 5 mmol/L (ref 3.5–5.1)
Potassium: 5.5 mmol/L — ABNORMAL HIGH (ref 3.5–5.1)
Potassium: 5.5 mmol/L — ABNORMAL HIGH (ref 3.5–5.1)
Potassium: 5.6 mmol/L — ABNORMAL HIGH (ref 3.5–5.1)
Potassium: 6 mmol/L — ABNORMAL HIGH (ref 3.5–5.1)
Potassium: 6 mmol/L — ABNORMAL HIGH (ref 3.5–5.1)
Potassium: 6.7 mmol/L (ref 3.5–5.1)
Sodium: 138 mmol/L (ref 135–145)
Sodium: 139 mmol/L (ref 135–145)
Sodium: 140 mmol/L (ref 135–145)
Sodium: 140 mmol/L (ref 135–145)
Sodium: 140 mmol/L (ref 135–145)
Sodium: 141 mmol/L (ref 135–145)
Sodium: 142 mmol/L (ref 135–145)
TCO2: 19 mmol/L — ABNORMAL LOW (ref 22–32)
TCO2: 20 mmol/L — ABNORMAL LOW (ref 22–32)
TCO2: 21 mmol/L — ABNORMAL LOW (ref 22–32)
TCO2: 21 mmol/L — ABNORMAL LOW (ref 22–32)
TCO2: 23 mmol/L (ref 22–32)
TCO2: 24 mmol/L (ref 22–32)
TCO2: 25 mmol/L (ref 22–32)

## 2021-07-13 LAB — POCT I-STAT EG7
Acid-base deficit: 4 mmol/L — ABNORMAL HIGH (ref 0.0–2.0)
Bicarbonate: 23.3 mmol/L (ref 20.0–28.0)
Calcium, Ion: 0.96 mmol/L — ABNORMAL LOW (ref 1.15–1.40)
HCT: 27 % — ABNORMAL LOW (ref 39.0–52.0)
Hemoglobin: 9.2 g/dL — ABNORMAL LOW (ref 13.0–17.0)
O2 Saturation: 77 %
Potassium: 5.3 mmol/L — ABNORMAL HIGH (ref 3.5–5.1)
Sodium: 142 mmol/L (ref 135–145)
TCO2: 25 mmol/L (ref 22–32)
pCO2, Ven: 54.7 mmHg (ref 44.0–60.0)
pH, Ven: 7.237 — ABNORMAL LOW (ref 7.250–7.430)
pO2, Ven: 49 mmHg — ABNORMAL HIGH (ref 32.0–45.0)

## 2021-07-13 LAB — PREPARE PLATELET PHERESIS: Unit division: 0

## 2021-07-13 MED ORDER — PROTAMINE SULFATE 10 MG/ML IV SOLN
INTRAVENOUS | Status: DC | PRN
  Administered 2021-07-13: 25 mg via INTRAVENOUS
  Administered 2021-07-13 (×3): 50 mg via INTRAVENOUS
  Administered 2021-07-13: 25 mg via INTRAVENOUS
  Administered 2021-07-13: 50 mg via INTRAVENOUS

## 2021-07-13 MED ORDER — SODIUM BICARBONATE 8.4 % IV SOLN
INTRAVENOUS | Status: DC | PRN
  Administered 2021-07-13: 50 meq via INTRAVENOUS

## 2021-07-13 MED ORDER — NITROGLYCERIN 0.2 MG/ML ON CALL CATH LAB
INTRAVENOUS | Status: DC | PRN
  Administered 2021-07-13: 60 ug via INTRAVENOUS
  Administered 2021-07-13: 20 ug via INTRAVENOUS

## 2021-07-13 MED ORDER — HEMOSTATIC AGENTS (NO CHARGE) OPTIME
TOPICAL | Status: DC | PRN
  Administered 2021-07-13: 1 via TOPICAL

## 2021-07-13 MED ORDER — SODIUM CHLORIDE 0.9 % IV SOLN
20.0000 ug | Freq: Once | INTRAVENOUS | Status: DC
  Filled 2021-07-13: qty 5

## 2021-07-13 MED ORDER — CALCIUM CHLORIDE 10 % IV SOLN
INTRAVENOUS | Status: DC | PRN
  Administered 2021-07-13: 500 mg via INTRAVENOUS
  Administered 2021-07-13: 200 mg via INTRAVENOUS
  Administered 2021-07-13: 1 g via INTRAVENOUS
  Administered 2021-07-13: 200 mg via INTRAVENOUS

## 2021-07-13 MED ORDER — EPINEPHRINE 1 MG/10ML IJ SOSY
PREFILLED_SYRINGE | INTRAMUSCULAR | Status: DC | PRN
  Administered 2021-07-13: 50 ug via INTRAVENOUS
  Administered 2021-07-13 (×2): 100 ug via INTRAVENOUS
  Administered 2021-07-13: 50 ug via INTRAVENOUS
  Administered 2021-07-13: 100 ug via INTRAVENOUS
  Administered 2021-07-13 (×2): 50 ug via INTRAVENOUS

## 2021-07-13 MED ORDER — NITROGLYCERIN IN D5W 200-5 MCG/ML-% IV SOLN
INTRAVENOUS | Status: DC | PRN
  Administered 2021-07-13: 25 ug/min via INTRAVENOUS

## 2021-07-13 MED ORDER — NOREPINEPHRINE 4 MG/250ML-% IV SOLN
INTRAVENOUS | Status: DC | PRN
  Administered 2021-07-13: 20 ug/min via INTRAVENOUS

## 2021-07-13 MED ORDER — ALBUTEROL SULFATE HFA 108 (90 BASE) MCG/ACT IN AERS
INHALATION_SPRAY | RESPIRATORY_TRACT | Status: AC
  Filled 2021-07-13: qty 13.4

## 2021-07-13 MED ORDER — VASOPRESSIN 20 UNIT/ML IV SOLN
INTRAVENOUS | Status: DC | PRN
  Administered 2021-07-13: 2 [IU] via INTRAVENOUS
  Administered 2021-07-13: 5 [IU] via INTRAVENOUS

## 2021-07-13 MED ORDER — EPINEPHRINE 1 MG/10ML IJ SOSY
PREFILLED_SYRINGE | INTRAMUSCULAR | Status: AC
  Filled 2021-07-13: qty 20

## 2021-07-13 MED ORDER — COAGULATION FACTOR VIIA RECOMB 1 MG IV SOLR
90.0000 ug/kg | Freq: Once | INTRAVENOUS | Status: AC
  Administered 2021-07-13: 7 mg via INTRAVENOUS
  Filled 2021-07-13: qty 2

## 2021-07-13 MED ORDER — PHENYLEPHRINE HCL (PRESSORS) 10 MG/ML IV SOLN
INTRAVENOUS | Status: AC
  Filled 2021-07-13: qty 2

## 2021-07-13 MED ORDER — ALBUMIN HUMAN 5 % IV SOLN
INTRAVENOUS | Status: DC | PRN
Start: 2021-07-13 — End: 2021-07-13

## 2021-07-14 LAB — ECHO INTRAOPERATIVE TEE
AV Mean grad: 10 mmHg
AV Peak grad: 19 mmHg
AV Vena cont: 0.34 cm
Ao pk vel: 2.18 m/s
Height: 66 in
Weight: 2832 oz

## 2021-07-15 LAB — TYPE AND SCREEN
ABO/RH(D): A POS
Antibody Screen: NEGATIVE
Unit division: 0
Unit division: 0
Unit division: 0
Unit division: 0
Unit division: 0
Unit division: 0
Unit division: 0
Unit division: 0
Unit division: 0
Unit division: 0
Unit division: 0
Unit division: 0
Unit division: 0
Unit division: 0
Unit division: 0
Unit division: 0
Unit division: 0
Unit division: 0
Unit division: 0
Unit division: 0
Unit division: 0
Unit division: 0

## 2021-07-15 LAB — BPAM RBC
Blood Product Expiration Date: 202209082359
Blood Product Expiration Date: 202209132359
Blood Product Expiration Date: 202209262359
Blood Product Expiration Date: 202209262359
Blood Product Expiration Date: 202210012359
Blood Product Expiration Date: 202210012359
Blood Product Expiration Date: 202210012359
Blood Product Expiration Date: 202210012359
Blood Product Expiration Date: 202210012359
Blood Product Expiration Date: 202210012359
Blood Product Expiration Date: 202210022359
Blood Product Expiration Date: 202210022359
Blood Product Expiration Date: 202210022359
Blood Product Expiration Date: 202210022359
Blood Product Expiration Date: 202210022359
Blood Product Expiration Date: 202210022359
Blood Product Expiration Date: 202210022359
Blood Product Expiration Date: 202210052359
Blood Product Expiration Date: 202210052359
Blood Product Expiration Date: 202210052359
Blood Product Expiration Date: 202210052359
Blood Product Expiration Date: 202210052359
ISSUE DATE / TIME: 202209021732
ISSUE DATE / TIME: 202209021732
ISSUE DATE / TIME: 202209022257
ISSUE DATE / TIME: 202209022257
ISSUE DATE / TIME: 202209022257
ISSUE DATE / TIME: 202209022257
ISSUE DATE / TIME: 202209022358
ISSUE DATE / TIME: 202209022358
ISSUE DATE / TIME: 202209030009
ISSUE DATE / TIME: 202209030009
ISSUE DATE / TIME: 202209030009
ISSUE DATE / TIME: 202209030009
ISSUE DATE / TIME: 202209042229
ISSUE DATE / TIME: 202209050414
Unit Type and Rh: 6200
Unit Type and Rh: 6200
Unit Type and Rh: 6200
Unit Type and Rh: 6200
Unit Type and Rh: 6200
Unit Type and Rh: 6200
Unit Type and Rh: 6200
Unit Type and Rh: 6200
Unit Type and Rh: 6200
Unit Type and Rh: 6200
Unit Type and Rh: 6200
Unit Type and Rh: 6200
Unit Type and Rh: 6200
Unit Type and Rh: 6200
Unit Type and Rh: 6200
Unit Type and Rh: 6200
Unit Type and Rh: 6200
Unit Type and Rh: 6200
Unit Type and Rh: 6200
Unit Type and Rh: 6200
Unit Type and Rh: 6200
Unit Type and Rh: 6200

## 2021-07-15 LAB — BPAM CRYOPRECIPITATE
Blood Product Expiration Date: 202209030455
Blood Product Expiration Date: 202209030455
ISSUE DATE / TIME: 202209022310
ISSUE DATE / TIME: 202209022310
Unit Type and Rh: 6200
Unit Type and Rh: 6200

## 2021-07-15 LAB — BPAM FFP
Blood Product Expiration Date: 202209072359
Blood Product Expiration Date: 202209072359
Blood Product Expiration Date: 202209072359
Blood Product Expiration Date: 202209072359
ISSUE DATE / TIME: 202209022301
ISSUE DATE / TIME: 202209022301
ISSUE DATE / TIME: 202209022301
ISSUE DATE / TIME: 202209022301
Unit Type and Rh: 6200
Unit Type and Rh: 6200
Unit Type and Rh: 6200
Unit Type and Rh: 6200

## 2021-07-15 LAB — PREPARE CRYOPRECIPITATE
Unit division: 0
Unit division: 0

## 2021-07-15 LAB — PREPARE FRESH FROZEN PLASMA
Unit division: 0
Unit division: 0

## 2021-07-16 ENCOUNTER — Encounter (HOSPITAL_COMMUNITY): Payer: Self-pay | Admitting: Cardiology

## 2021-07-16 NOTE — Addendum Note (Signed)
Addendum  created 07/16/21 1019 by Josephine Igo, CRNA   Intraprocedure Meds edited

## 2021-07-19 MED FILL — Heparin Sodium (Porcine) Inj 1000 Unit/ML: INTRAMUSCULAR | Qty: 10 | Status: AC

## 2021-07-19 MED FILL — Potassium Chloride Inj 2 mEq/ML: INTRAVENOUS | Qty: 40 | Status: AC

## 2021-07-19 MED FILL — Calcium Chloride Inj 10%: INTRAVENOUS | Qty: 10 | Status: AC

## 2021-07-19 MED FILL — Mannitol IV Soln 20%: INTRAVENOUS | Qty: 500 | Status: AC

## 2021-07-19 MED FILL — Electrolyte-R (PH 7.4) Solution: INTRAVENOUS | Qty: 6000 | Status: AC

## 2021-07-19 MED FILL — Lidocaine HCl Local Preservative Free (PF) Inj 1%: INTRAMUSCULAR | Qty: 5 | Status: AC

## 2021-07-19 MED FILL — Sodium Chloride IV Soln 0.9%: INTRAVENOUS | Qty: 2000 | Status: AC

## 2021-07-19 MED FILL — Heparin Sodium (Porcine) Inj 1000 Unit/ML: INTRAMUSCULAR | Qty: 30 | Status: AC

## 2021-07-19 MED FILL — Magnesium Sulfate Inj 50%: INTRAMUSCULAR | Qty: 10 | Status: AC

## 2021-07-19 MED FILL — Sodium Bicarbonate IV Soln 8.4%: INTRAVENOUS | Qty: 50 | Status: AC

## 2021-07-19 MED FILL — Tranexamic Acid IV Soln 1000 MG/10ML (100 MG/ML): INTRAVENOUS | Qty: 3000 | Status: AC

## 2021-08-10 NOTE — Anesthesia Procedure Notes (Signed)
Arterial Line Insertion Start/End09/17/2022 4:50 PM, 07/29/2021 4:53 PM Performed by: Eligha Bridegroom, CRNA, CRNA  Patient location: OR. Emergency situation Right, radial was placed Catheter size: 20 G Hand hygiene performed  and Seldinger technique used Allen's test indicative of satisfactory collateral circulation Procedure performed without using ultrasound guided technique. Following insertion, dressing applied and Biopatch. Patient tolerated the procedure well with no immediate complications.

## 2021-08-10 NOTE — Transfer of Care (Signed)
Immediate Anesthesia Transfer of Care Note  Patient: Robert Boyer  Procedure(s) Performed: CORONARY ARTERY BYPASS GRAFTING (CABG) TIMES THREE USING LEFT INTERNAL MAMMARY ARTERY AND RIGHT GREATER SAPHENOUS VEIN HARVESTED ENDOSCOPICALLY (Chest) PLACEMENT OF IMPELLA LEFT VENTRICULAR ASSIST DEVICE CP TRANSESOPHAGEAL ECHOCARDIOGRAM (TEE) APPLICATION OF CELL SAVER  Patient Location: ICU  Anesthesia Type:General  Level of Consciousness: Patient remains intubated per anesthesia plan  Airway & Oxygen Therapy: Patient remains intubated per anesthesia plan  Post-op Assessment: Report given to RN and Post -op Vital signs reviewed and unstable, Anesthesiologist notified  Post vital signs: unstable  Last Vitals: [name] and [name] at bedside for family to visit patient for non-escalation of care/comfort measures.    Complications: No notable events documented.

## 2021-08-10 NOTE — Progress Notes (Addendum)
  Patient received repeated boluses of epi to be able to make it out of OR.   I personally assisted with transferring him to CCU with Impella support and Epi at 20. NE at 19. Milrinone at 0.375.   We then brought his sons back to visit. After a short visit they left the unit and agreed with transition to comfort care.  Patient then dropped his pressure again.   Impella and drips stopped. He was terminally extubated. Pacer turned off and he was immediately asystolic.   We pronounced him at 319a.   Additional CCT 60 mins.  Glori Bickers, MD  3:20 AM

## 2021-08-10 NOTE — Procedures (Signed)
Extubation Procedure Note  Patient Details:   Name: Robert Boyer DOB: 20-Jan-1946 MRN: [id number]   Airway Documentation:  Airway 8 mm (Active)  Secured at (cm) 22 cm 07/11/2021 0007   Vent end date: (not recorded) Vent end time: (not recorded)   Evaluation  O2 sats: stable throughout Complications: No apparent complications Patient did tolerate procedure well.     Patient compassionately extubated to room air. Rn x 2 at bedside, MD aware. RT will cont to monitor.   [name] 07/28/2021, 3:08 AM

## 2021-08-10 NOTE — Anesthesia Procedure Notes (Signed)
Procedure Name: Intubation Date/Time: 07/26/2021 5:01 PM Performed by: Eligha Bridegroom, CRNA Pre-anesthesia Checklist: Patient identified, Emergency Drugs available, Suction available and Patient being monitored Patient Re-evaluated:Patient Re-evaluated prior to induction Oxygen Delivery Method: Circle System Utilized Preoxygenation: Pre-oxygenation with 100% oxygen Induction Type: IV induction, Rapid sequence and Cricoid Pressure applied Laryngoscope Size: Mac and 4 Tube type: Oral Tube size: 8.0 mm Number of attempts: 1 Airway Equipment and Method: Stylet Placement Confirmation: ETT inserted through vocal cords under direct vision, positive ETCO2 and breath sounds checked- equal and bilateral Secured at: 22 cm Tube secured with: Tape Dental Injury: Teeth and Oropharynx as per pre-operative assessment

## 2021-08-10 NOTE — Anesthesia Postprocedure Evaluation (Signed)
Anesthesia Post Note  Patient: Robert Boyer  Procedure(s) Performed: CORONARY ARTERY BYPASS GRAFTING (CABG) TIMES THREE USING LEFT INTERNAL MAMMARY ARTERY AND RIGHT GREATER SAPHENOUS VEIN HARVESTED ENDOSCOPICALLY (Chest) PLACEMENT OF IMPELLA LEFT VENTRICULAR ASSIST DEVICE CP TRANSESOPHAGEAL ECHOCARDIOGRAM (TEE) APPLICATION OF CELL SAVER     Patient location during evaluation: SICU Anesthesia Type: General Level of consciousness: sedated Pain management: pain level controlled Vital Signs Assessment: vitals unstable Respiratory status: patient remains intubated per anesthesia plan Cardiovascular status: unstable Postop Assessment: no apparent nausea or vomiting Anesthetic complications: no Comments: Pt is critically ill post-operatively.  Hemodynamics are unstable and heavily supported with high doses of pressor and inotropic gtts.  An impella ventricular assist device is providing nearly all of the current cardiac output.   No notable events documented.  Last Vitals:  Vitals:   07/20/2021 1634 08/03/2021 1639  BP:    Pulse: (!) 242 (!) 0  Resp: (!) 6 (!) 0  SpO2: (!) 0% (!) 0%    Last Pain:  Vitals:   08/08/2021 1623  PainSc: 0-No pain                 Juwana Thoreson S

## 2021-08-10 NOTE — Progress Notes (Addendum)
Advanced HF Consult/Shock note  75 yo male with COPD, PAD, DM2 admitted with anterolateral STEMI.   Emergent cath showed complex LAD/diagonal disease. LVEDP 41.   Unable to place Impella from groin due to severe PAD.   Taken for emergent CABG by Dr. Roxan Hockey.   Underwent successful graft placement but unable to wean from Cardiopulmonary bypass. Decision made to place axillary Impella. Graft placed but unable to pass pump.   I was paged at 11p to assist with pump placement and hemodynamic management.   We were able to pass the wire into LV but unable to pass Impella 5.5 into LV due to severe narrowing of R subclavian. Decision made to abandon 5.5 placement.   Dr. Roxan Hockey repaired damage to R subclavian. We were then able to pass the Impella wire back into the LV and with Dr Bonney Roussel assistance, an Impella CP was placed with good position on TEE and fluoro.   We then attempted to wean bypass but failed due to severe HTN markedly elevated LVEDP > 40.   TEE showed LVEF 20% with moderate to severe RV dysfunction.   With anesthesia's assistance we titrated drips and after another 30-45 mins we re-attempted bypass wean. Patient disconnected from bypass and Impella increased to P-9. Patient then dropped his pressure to low 30s. Epi  increased to 20 and NE to 60 with return of pressure and MAPs 70-100.   Bypass cannulas subsequently removed by Dr. Roxan Hockey and protamine given.   Impella P-6 with flow 3.0 was highest we could attain.   Patient continued to drop pressures. We could only maintain a pulse with epi pushes.   I personally spoke to his two sons and discussed the situation with them and that he would likely not make it out of the OR. Dr Roxan Hockey placed chest tubes and Esmark dressing.   Will attempt to get patient back to CCU to have his sons see him before he passes.   Total CCM time (not including assistance with procedures) 130 mins.   Glori Bickers, MD.

## 2021-08-10 NOTE — Op Note (Signed)
NAME: Robert Boyer, Robert Boyer MEDICAL RECORD NO: [id number] ACCOUNT NO: [id number] DATE OF BIRTH: Oct 10, 1946 FACILITY: [facility] LOCATION: [facility] PHYSICIAN: Revonda Standard. Roxan Hockey, MD  Operative Report   DATE OF PROCEDURE: 07/11/2021  PREOPERATIVE DIAGNOSES:  Severe coronary artery disease with ST elevation MI and cardiogenic shock.  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  PROCEDURE PERFORMED:   Median sternotomy, extracorporeal circulation,  Coronary artery bypass grafting x 3  Left internal mammary artery to LAD,  Sequential saphenous vein graft to posterior descending and posterolateral   Endoscopic  vein harvest, right thigh.   Placement of Impella CP ventricular assist device.  SURGEON: Revonda Standard. Roxan Hockey, MD  ASSISTANT:  Enid Cutter, PAC  INTRAOPERATIVE CONSULTANTS: Vernell Leep, MD, Glori Bickers, MD  ANESTHESIA: General.  FINDINGS:  Transesophageal echocardiography showed severe left ventricular dysfunction with ejection fraction of approximately 20% with severe apical, anterior and septal hypokinesis and global moderate hypokinesis.  Post-bypass ejection fraction of  approximately 10% with severe global hypokinesis. LAD poor quality with diffuse disease distally. Posterior descending and posterolateral good quality.  Grafts good quality.  Porcelain aorta.  Coronary sinus injury noted after weaning from bypass.  Unable to pass Impella 5.5 past origin of the innominate artery.  Good position of the Impella CP device.  CLINICAL NOTE PROCEDURE:  Robert Boyer is a 75 year old man with known vascular disease and congestive heart failure.  He presented with over 24 hours of chest pain, which worsened on the morning of admission, he called EMS.  An EKG showed an  anterolateral ST elevation MI.  He was taken emergently to the catheterization laboratory.  His pain improved, although he did have  some ongoing chest tightness.  He had catheterization, which showed a complex calcified proximal LAD lesion with a small  distal vessel, either spasm versus diffuse disease and a mid right coronary stenosis.  He was in cardiogenic shock and had an LVEDP of 40. Because of his previous aortobifemoral bypass, there was no access for placement of an assist device for  angioplasty.  Consulted and after discussions with Dr. Virgina Jock, we felt the best option was emergent coronary bypass grafting and placement of assist device in the operating room.  The indications, risks, benefits, and alternatives were discussed with  the patient.  He understood the high risk nature of the procedure.  He consented to proceed.  DESCRIPTION OF PROCEDURE:  Robert Boyer was brought emergently from the catheterization laboratory to the operating room on 08/04/2021.  Anesthesia placed a Swan-Ganz catheter and arterial blood pressure monitoring line. He was anesthetized and intubated.   Foley catheter was placed.  Intravenous antibiotics were administered.  The chest, abdomen and legs were prepped and draped in the usual sterile fashion.  Timeout was performed.  Median sternotomy was performed.  The left internal mammary artery was harvested using standard technique.  Simultaneously, an incision was made in the medial aspect of the right leg at the level of the knee.  The greater saphenous  vein was harvested endoscopically from the right thigh.  Both mammary and vein were good quality vessels.  The patient was heparinized during the vessel harvest.  A sternal retractor was placed.  The pericardium was opened.  There was cardiomegaly.  The ascending aorta was inspected.  It was essentially a porcelain aorta.  There were areas that could be used for the crossclamp and a small area for placement of a  single proximal.  After confirming  adequate anticoagulation with ACT measurement, the aorta was cannulated on the underside of the  proximal arch across from the takeoff of the innominate. A dual stage venous cannula was placed via a pursestring suture in  the right atrium.  The right atrium was markedly distended. Cardiopulmonary bypass was initiated.  Flows were maintained per protocol.  The patient was cooled to 32 degrees Celsius.  Coronary arteries were inspected and anastomotic sites were chosen.   Of note, the LAD was diffusely diseased distally and was not suitable target for grafting there.  The posterior descending and posterolateral were good quality target vessels.  A retrograde cardioplegia cannula was placed via pursestring suture in the  right atrium and directed into the coronary sinus and advanced without difficulty and there was a good wedge tracing with balloon inflation. A foam pad was placed in the pericardium to insulate the heart.  A temperature probe was placed in the  myocardial septum and a cardioplegia cannula was placed in the ascending aorta.  The aorta was crossclamped in the one area that was free of severe calcification. Cardiac arrest was achieved with a combination of cold antegrade and retrograde blood cardioplegia and topical iced saline.  There was a rapid diastolic arrest and septal  cooling to 10 degrees Celsius.  A reversed saphenous vein graft then was placed sequentially to the posterior descending and posterolateral branches of the right coronary.  These were both good quality target vessels.  The vein was of good quality.  A side-to-side anastomosis was  performed to the posterior descending and end-to-side at the posterolateral. Both were done with a running 7-0 Prolene sutures.  The anastomoses were probed proximally and distally at the completion.  Cardioplegia was administered down the graft with  good flow and good hemostasis.  Additional cardioplegia was also administered via the retrograde catheter.  The left internal mammary artery was brought through an incision in the  pericardium.  The distal end was beveled.  An arteriotomy was made in the LAD near the takeoff of the  distal diagonal branch.  The LAD was unsuitable for grafting beyond that, although a 1 mm probe did pass into the distal LAD, a 1.5 mm probe passed proximally and also passed into the diagonal branch.  The mammary was of good quality.  It was anastomosed  end to side with a running 8-0 Prolene suture.  At completion anastomosis, it was probed in all directions with good patency, again only a 1 mm probe passed into the distal LAD.  The bulldog clamp was briefly removed.  There was good hemostasis. Septal  rewarming was noted.  The bulldog clamp was replaced.  Additional cardioplegia was administered.  The vein graft was cut to length.  The cardioplegia cannula was removed from the ascending aorta.  The proximal vein graft anastomosis was performed to a  4.5 mm punch aortotomy with a running 6-0 Prolene suture.  Again, this was an area that was relatively free of calcification.  At the completion of the proximal anastomosis, the patient was placed in Trendelenburg position.  A warm dose of retrograde cardioplegia was administered.  The aortic root was de-aired and the aortic crossclamp was removed.  Total crossclamp time was 72  minutes.  While rewarming was completed, the proximal and distal anastomoses were inspected for hemostasis.  Epicardial pacing wires were placed on the right ventricle and right atrium. The patient received a loading dose of milrinone and then a milrinone  infusion was initiated at  0.375 mcg per kilogram per minute. Cardioplegia cannula was removed.  An attempt was made to wean from cardiopulmonary bypass.  There was severe left ventricular dysfunction. There also was venous bleeding coming from behind the heart.  The patient was placed back on full support and it was noted that there was a tear in the  coronary sinus.  This was repaired with 5-0 Prolene pledgeted  sutures.  Another attempt was made to wean from bypass, but there was severe left ventricular dysfunction with systemic hypotension and pulmonary hypertension and it was clear the patient would not wean without mechanical support.  An incision was made in the right deltopectoral groove.  The axillary artery was dissected out.  It was relatively small, but free of disease at that site.  An 8 mm Hemashield graft was bevelled and it was anastomosed end-to-side with a running 5-0  Prolene suture.  The graft was cut to an appropriate length and the insertion device for the Impella was inserted into the graft and secured. Insertion of the wire proved to be difficult with the patient having mild to moderate aortic stenosis.  Dr.  Haroldine Laws and Dr. Virgina Jock were consulted to assist with wire manipulation. Before they arrived, the wire did pass into the left ventricle.  An attempt was made to pass the Impella 5.5 device; however, the wire became dislodged before the device could  be advanced into the heart. With the arrival of the cardiologists, they took over management of the wire.  The wire was passed back into the left ventricle; however, attempts to advance an Impella 5.5 were unsuccessful. Palpation and fluoroscopy  confirmed that the device would not pass the origin of the innominate artery. During attempts to advance the device, the graft partially avulsed from the axillary artery.  The device was removed and the axillary artery was repaired. Although limited in  flow, the only option was felt to place an Impella CP device and that was advanced into the left ventricle.  Once that device was in place, an attempt was made to wean from bypass.  Unfortunately, even with the Impella CP device in good position, the  patient had severe hypotension and a markedly elevated pulmonary artery pressures.  TEE showed severe global hypokinesis and right ventricular dysfunction that was moderate to severe as well.  He  ultimately was weaned after titrating multiple inotropes  including milrinone, epinephrine, norepinephrine and vasopressin.  Even with that, the patient had severe left ventricular dysfunction and required boluses of epinephrine to maintain a blood pressure.  The patient was not felt to be a candidate for ECMO.   Decision was made to decannulate and take the patient to the ICU so the family could visit with him prior to expiration.  The patient was decannulated.  Resuscitation continued.  Test dose protamine was administered.  The remainder of the protamine  was administered.  The sternum could not be closed.  An Esmarch was sewn to the sternum after placing 3 chest tubes through separate subcostal incisions. On the counts, there was a missing needle.  An x-ray was done and no foreign body was identified.   The patient then was taken from the operating room to the surgical intensive care unit, intubated in critical and unstable condition.     PAA D: 07/23/2021 7:34:36 am T: 07/23/2021 8:20:00 am  JOB: [id number]/ [id number]

## 2021-08-10 NOTE — Brief Op Note (Signed)
07/17/2021 - 08/07/21  3:32 AM  PATIENT:  Robert Boyer  75 y.o. male  PRE-OPERATIVE DIAGNOSIS:  Severe CAD with STEMI and cardiogenic shock  POST-OPERATIVE DIAGNOSIS:  Severe CAD with STEMI and cardiogenic shock. Porcelain aorta.  PROCEDURE:  Procedure(s): CORONARY ARTERY BYPASS GRAFTING (CABG) TIMES THREE USING LEFT INTERNAL MAMMARY ARTERY AND RIGHT GREATER SAPHENOUS VEIN HARVESTED ENDOSCOPICALLY (N/A) PLACEMENT OF IMPELLA LEFT VENTRICULAR ASSIST DEVICE CP (N/A) TRANSESOPHAGEAL ECHOCARDIOGRAM (TEE) (N/A) APPLICATION OF CELL SAVER  SURGEON:  Surgeon(s) and Role:    * Melrose Nakayama, MD - Primary  PHYSICIAN ASSISTANT: Enid Cutter, PA   ANESTHESIA:   general  EBL:  4000 mL   BLOOD ADMINISTERED: - CC PRBC, - CC CELLSAVER, - FFP, and - PLTS  DRAINS:  3 chest tubes, Impella device right axillary artery    LOCAL MEDICATIONS USED:  NONE  SPECIMEN:  No Specimen  DISPOSITION OF SPECIMEN:  N/A  COUNTS:  NO missing 1 needle - CXR done  TOURNIQUET:  * No tourniquets in log *  DICTATION: .Other Dictation: Dictation Number -  PLAN OF CARE: Admit to inpatient   PATIENT DISPOSITION:  ICU - intubated and critically ill.   Delay start of Pharmacological VTE agent (>24hrs) due to surgical blood loss or risk of bleeding: not applicable

## 2021-08-10 NOTE — Death Summary Note (Signed)
Physician Discharge Summary  Patient ID: Nain Rudd MRN: [id number] DOB/AGE: [birth year] 75 y.o.  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    Emergent right and left heart catheterization coronary angiography showed severe CAD, primarily mid LAD diagonal bifurcation lesion with severe calcification, and moderate stenosis and dominant mid RCA.  Circumflex was small.  LAD bifurcation lesion was felt to be the culprit.  There was slow flow in LAD with distal LAD showing either diffuse spasm or diffuse disease or underfilled vessel. Patient was in cardiogenic shock with elevated LVEDP >40 mmHg.  There was no good option for Impella placement given patient's prior aorto bifemoral bypass.  I consulted vascular surgery and cardiothoracic surgery.  We will feel that best option was emergent CABG with placement of Impella 5.5 through right subclavian artery. Please see detailed surgical note below. Briefly, patient could not be weaned off pump. Impella 5.5 could not be passed due to presumed severe calcificaiton and tortuosity in right subclavian artery and ascending aorta. Impella CP was successfully placed. However, patient continued to be in severe cardiogenic shock requiring multiple inotropes, in addition to Impella CP. The patient was not felt to be a candidate for ECMO. Heart failure specialist Dr. Haroldine Laws was intricately involved in patient's critical care. He was eventually made comfort care after discussion with the family. He passed few hours later in the ICU.     Significant Diagnostic  Studies:  EKG 2021/07/20: Sinus rhythm Anterolateral STEMI  RHC/LHC, coronary angiography, abdominal aortography 07/20/21: LM: Normal LAD: Prox LAD/D1 95% severely calcified lesion         Distal LAD either has diffuse spasm, diffuse disease, or is simply under-filled Lcx: Diminutive vessel with no significant disease RCA: Super-dominant vessel          Mid focal 60% eccentric lesion          RPDA 30% disease   RA: 15 mmHg RV: 55/14 mmHg PA: 56/26 mmHg, mPAP 45 mmHg PCW: 39 mmHg   CO: 3.4 L/min CI: 1.8 L/min/m2   Distal aorta occlusion Patent aorto-bifem bypass Left renal artery occlusion   Chest pain free, with stable blood pressures. However, hemodynamically, he is in pulmonary edema and probably impending cardiogenic shock. PCI to LAD/Diag would only be feasible with atherectomy and hemodynamic support. Prior aorto-bifem bypass makes femoral access for Impella support not ideal, given the risk of graft occlusion/infection etc.   I dicussed the case my interventional colleagues, as well as vascular surgeon and cardiothoracic surgeon. Given limitations for percutaneous support, his best option was felt to be axillary 5.5 support and CABG. Appreciate Dr. Farley Ly consult, who decided to take the patient for urgent CABG.   I spoke with the patient, and his son Salvatore Shear and explained our above recommendations.  Operative report July 21, 2021: (Dr. Roxan Hockey) Operative Report    DATE OF PROCEDURE: 07-20-2021   PREOPERATIVE DIAGNOSES:  Severe coronary artery disease with ST elevation MI and cardiogenic shock.   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   PROCEDURE PERFORMED:  Median sternotomy, extracorporeal circulation, coronary artery bypass grafting x3 (left internal  mammary artery to LAD, sequential saphenous vein graft to posterior descending and posterolateral).  Endoscopic  vein harvest,  right  thigh.  Placement of Impella CP ventricular assist device.   SURGEON: Revonda Standard. Roxan Hockey, MD   ASSISTANT:  Enid Cutter, [facility]   ANESTHESIA: General.   FINDINGS:  Transesophageal echocardiography showed severe left ventricular dysfunction with ejection fraction of approximately 20% with severe apical, anterior and septal hypokinesis and global moderate hypokinesis.  Post-bypass ejection fraction of  approximately 10% with severe global hypokinesis. LAD poor quality with diffuse disease distally. The posterior descending and posterolateral good quality, grafts good quality, porcelain aorta, coronary sinus injury noted after weaning from bypass,  unable to pass Impella 5.5 past origin of the innominate artery.  Good position of the Impella CP device.   CLINICAL NOTE PROCEDURE:  The patient is a 75 year old man with known vascular disease and congestive heart failure.  He presented with over 24 hours of chest pain, which worsened on the morning of admission, he called EMS.  An EKG showed an  anterolateral ST elevation MI.  He was taken emergently to the catheterization laboratory.  His pain improved, although he did have some ongoing chest tightness.  He had catheterization, which showed a complex calcified proximal LAD lesion with a small  distal vessel, either spasm versus diffuse disease and a mid right coronary stenosis.  He was in cardiogenic shock and had an LVEDP of 40. Because of his previous aortobifemoral bypass, there was no access for placement of an assist device for  angioplasty.  Consulted and after discussions with Dr. Virgina Jock, we felt the best option was emergent coronary bypass grafting and placement of assist device in the operating room.  The indications, risks, benefits, and alternatives were discussed with  the patient.  He understood the high risk nature of the procedure.  He consented to proceed.   DESCRIPTION OF PROCEDURE:  The patient was brought emergently  from the catheterization laboratory to the operating room on 08/08/2021.  Anesthesia placed a Swan-Ganz catheter and arterial blood pressure monitoring line. He was anesthetized and intubated.   Foley catheter was placed.  Intravenous antibiotics were administered.  The chest, abdomen and legs were prepped and draped in the usual sterile fashion.   Timeout was performed.  Median sternotomy was performed.  The left internal mammary artery was harvested using standard technique.  Simultaneously, incision was made in the medial aspect of the right leg to the level of the knee.  The greater saphenous  vein was harvested endoscopically from the right thigh.  Both mammary and vein were good quality vessels.  The patient was heparinized during the vessel harvest.   A sternal retractor was placed.  The pericardium was opened.  There was cardiomegaly.  The ascending aorta was inspected.  It was essentially a porcelain aorta.  There were areas that could be used for the crossclamp and a small area for placement of a  single proximal.  After confirming adequate anticoagulation with ACT measurement, the aorta was cannulated on the underside of the proximal arch across from the takeoff of the innominate. A dual stage venous cannula was placed via a pursestring suture in  the right atrium.  The right atrium was markedly distended. Cardiopulmonary bypass was initiated.  Flows were maintained per protocol.  The patient was cooled to 32 degrees Celsius.  Coronary arteries were inspected and anastomotic sites were chosen.   Of note, the LAD was diffusely diseased distally and was not suitable  target for grafting there.  The posterior descending and posterolateral were good quality target vessels.  A retrograde cardioplegia cannula was placed via pursestring suture in the  right atrium and directed into the coronary sinus and advanced without difficulty and there was a good registration with balloon inflation. A foam pad was  placed in the pericardium to insufflate the heart.  A temperature probe was placed in the  myocardial septum and a cardioplegic cannula was placed in the ascending aorta.   The aorta was crossclamped in the one area that was free of severe calcification. Cardiac arrest was achieved with a combination of cold antegrade and retrograde blood cardioplegia and topical iced saline.  There was a rapid diastolic arrest and septal  cooling to 10 degrees Celsius.   Reverse saphenous vein graft then was placed sequentially to the posterior descending and posterolateral branches of the right coronary.  These were both good quality target vessels.  The vein was of good quality.  A side-to-side anastomosis was  performed to the posterior descending and end-to-side at the posterolateral. Both were done with a running 7-0 Prolene sutures.  The anastomoses were probed proximally and distally at the completion.  Cardioplegia was administered down the graft with  good flow and good hemostasis.   Additional cardioplegia was also administered via the retrograde catheter.  The left internal mammary artery was brought through an incision in the pericardium.  The distal end was beveled.  An arteriotomy was made in the LAD near the takeoff of the  distal diagonal branch.  The LAD was unsuitable for grafting beyond that, although a 1 mm probe did pass into the distal LAD, a 1.5 mm probe passed proximally and also passed into the diagonal branch.  The mammary was of good quality.  It was anastomosed  end to side with a running 8-0 Prolene suture.  At completion anastomosis, it was probed in all directions with good patency, again only a 1 mm probe passed into the distal LAD.  The bulldog clamp was briefly removed.  There was good hemostasis. Septal  rewarming was noted.  The bulldog clamp was replaced.  Additional cardioplegia was administered.  The vein graft was cut to length.  The cardioplegia cannula was removed from the  ascending aorta.  The proximal vein graft anastomosis was performed to a  4.5 mm punch aortotomy with a running 6-0 Prolene suture.  Again, this was an area that was relatively free of calcification.   At the completion of the proximal anastomosis, the patient was placed in Trendelenburg position.  A warm dose of retrograde cardioplegia was administered.  The aortic root was de-aired and the aortic crossclamp was removed.  Total crossclamp time was 72  minutes.   While rewarming was completed, the proximal and distal anastomoses were inspected for hemostasis.  Epicardial pacing wires were placed on the right ventricle and right atrium. The patient received a loading dose of milrinone and then the milrinone  infusion was initiated at 0.375 mcg per kilogram per minute. Cardioplegia cannula was removed.   Attempt was made to wean from cardiopulmonary bypass.  There was severe left ventricular dysfunction. There also was venous bleeding coming from behind the heart.  The patient was placed back on full support and it was noted that there was a tear in the  coronary sinus.  This was repaired with 5-0 Prolene pledgeted sutures.   Another attempt was made to wean from bypass, but there was severe left ventricular dysfunction  with systemic hypotension and pulmonary hypertension and it was clear the patient would not wean without mechanical support.   An incision was made in the right deltopectoral groove.  The axillary artery was dissected out.  It was relatively small, but free of disease at that site.  An 8 mm Hemashield graft was bevelled and it was anastomosed end-to-side with a running 5-0  Prolene suture.  The graft was cut to an appropriate length and the insertion device for the Impella was inserted into the graft and secured. Insertion of the wire proved to be difficult with the patient having mild to moderate aortic stenosis.  Dr.  Marland Kitchen and Dr. Virgina Jock were consulted to assist with wire  manipulation. Before they arrived, the wire did pass into the left ventricle.  An attempt was made to pass the Impella 5.5 device; however, the wire became dislodged before the device could  be advanced into the heart. With the arrival of the cardiologists, they took over management of the wire.  The wire was passed back into the left ventricle; however, attempts to advance an Impella 5.5 were unsuccessful. Palpation and fluoroscopy  confirmed that the device would not pass the origin of the innominate artery. During attempts to advance the device, the graft partially avulsed from the axillary artery.  The device was removed and the axillary artery was repaired. Although limited in  flow, the only option was felt to place an Impella CP device and that was advanced into the left ventricle.  Once that device was in place, an attempt was made to wean from bypass.  Unfortunately, even with the Impella CP device in good position, the  patient had severe hypotension and a markedly elevated pulmonary artery pressures.  TEE showed severe global hypokinesis and right ventricular dysfunction that was moderate to severe as well.  He ultimately was weaned after titrating multiple inotropes  including milrinone, epinephrine, norepinephrine and vasopressin.  Even with that, the patient had severe left ventricular dysfunction and required boluses of epinephrine to maintain a blood pressure.  The patient was not felt to be a candidate for ECMO.   Decision was made to decannulate and take the patient to the ICU so the family could visit with him prior to exploration.  The patient was decannulated.  Resuscitation continued.  Test dose protamine was administered.  The remainder of the protamine  was administered.  The sternum could not be closed.  An Esmarch was sewn to the sternum after placing 3 chest tubes through separate subcostal incisions. On the counts, there was a missing needle.  An x-ray was done and no foreign body  was identified.   The patient then was taken from the operating room to the surgical intensive care unit, intubated in critical and unstable condition.     Labs:   Lab Results  Component Value Date   WBC 14.8 (H) 07/26/2021   HGB 8.5 (L) 07/15/2021   HCT 25.0 (L) July 15, 2021   MCV 103.7 (H) 08/06/2021   PLT 105 (L) 07/26/2021   No results for input(s): NA, K, CL, CO2, BUN, CREATININE, CALCIUM, PROT, BILITOT, ALKPHOS, ALT, AST, GLUCOSE in the last 168 hours.  Invalid input(s): LABALBU  Lipid Panel     Component Value Date/Time   CHOL 83 08/08/2021 1410   TRIG 164 (H) 07/11/2021 1410   HDL 22 (L) 07/16/2021 1410   CHOLHDL 3.8 07/24/2021 1410   VLDL 33 08/09/2021 1410   LDLCALC 28 07/26/2021 1410    BNP (  last 3 results) No results for input(s): BNP in the last 8760 hours.  HEMOGLOBIN A1C Lab Results  Component Value Date   HGBA1C 6.2 (H) 07/19/2021   MPG 131.24 08/04/2021    Cardiac Panel (last 3 results) No results for input(s): CKTOTAL, CKMB, TROPONINI, RELINDX in the last 8760 hours.  Lab Results  Component Value Date   TROPONINI <0.30 09/07/2014     TSH No results for input(s): TSH in the last 8760 hours.  Radiology: DG Chest 1 View  Result Date: Aug 03, 2021 CLINICAL DATA:  Left ventricular assist device placement, intraoperative examination EXAM: CHEST  1 VIEW COMPARISON:  None. FINDINGS: Fluoroscopy Time: 1 hour, 12 minutes, 2 seconds Radiation Exposure Index (if provided by the fluoroscopic device): 771.35 mGy Number of Acquired Spot Images: 1 Imaging is extremely limited by fluoroscopic technique. Left ventricular assist device overlies the thoracic spine extends into the left paraspinal region, however, fluoroscopic technique precludes assessment of catheter position. Large caliber cannula are noted within the right paraspinal region, possibly overlying the superior and inferior vena cava. Soft tissue retractors overlie the visualized thorax. IMPRESSION:  Extremely limited examination demonstrating a left ventricular assist device overlying the thoracic spine and extending into the left paraspinal region. Electronically Signed   By: Fidela Salisbury M.D.   On: 08-03-2021 03:57   CARDIAC CATHETERIZATION  Addendum Date: 08/05/2021   LM: Normal LAD: Prox LAD/D1 95% severely calcified lesion         Distal LAD either has diffuse spasm, diffuse disease, or is simply under-filled Lcx: Diminutive vessel with no significant disease RCA: Super-dominant vessel          Mid focal 60% eccentric lesion          RPDA 30% disease RA: 15 mmHg RV: 55/14 mmHg PA: 56/26 mmHg, mPAP 45 mmHg PCW: 39 mmHg CO: 3.4 L/min CI: 1.8 L/min/m2 Distal aorta occlusion Patent aorto-bifem bypass Left renal artery occlusion Chest pain free, with stable blood pressures. However, hemodynamically, he is in pulmonary edema and probably impending cardiogenic shock. PCI to LAD/Diag would only be feasible with atherectomy and hemodynamic support. Prior aorto-bifem bypass makes femoral access for Impella support not ideal, given the risk of graft occlusion/infection etc. I dicussed the case my interventional colleagues, as well as vascular surgeon and cardiothoracic surgeon. Given limitations for percutaneous support, his best option was felt to be axillary 5.5 support and CABG. Appreciate Dr. Farley Ly consult, who decided to take the patient for urgent CABG. I spoke with the patient, and his son Jeron Grahn and explained our above recommendations. Nigel Mormon, MD Pager: [phone] Office: [phone]   Result Date: 07/18/2021 Images from the original result were not included. LM: Normal LAD: Prox LAD/D1 95% severely calcified lesion         Distal LAD either has diffuse spasm, diffuse disease, or is simply under-filled Lcx: Diminutive vessel with no significant disease RCA: Super-dominant vessel          Mid focal 60% eccentric lesion          RPDA 30% disease RA: 15 mmHg RV: 55/14 mmHg  PA: 56/26 mmHg, mPAP 45 mmHg PCW: 39 mmHg CO: 3.4 L/min CI: 1.8 L/min/m2 Chest pain free, with stable blood pressures. However, hemodynamically, he is in pulmonary edema and probably impending cardiogenic shock. PCI to LAD/Diag would only be feasible with atherectomy and hemodynamic support. Prior aorto-bifem bypass makes femoral access for Impella support not ideal, given the risk of graft occlusion/infection etc. I dicussed the  case my interventional colleagues, as well as vascular surgeon and cardiothoracic surgeon. Given limitations for percutaneous support, his best option was felt to be axillary 5.5 support and CABG. Appreciate Dr. Farley Ly consult, who decided to take the patient for urgent CABG. I spoke with the patient, and his son Banks Chaikin and explained our above recommendations. Nigel Mormon, MD Pager: [phone] Office: [phone]  PERIPHERAL VASCULAR CATHETERIZATION  Addendum Date: 07/31/2021   LM: Normal LAD: Prox LAD/D1 95% severely calcified lesion         Distal LAD either has diffuse spasm, diffuse disease, or is simply under-filled Lcx: Diminutive vessel with no significant disease RCA: Super-dominant vessel          Mid focal 60% eccentric lesion          RPDA 30% disease RA: 15 mmHg RV: 55/14 mmHg PA: 56/26 mmHg, mPAP 45 mmHg PCW: 39 mmHg CO: 3.4 L/min CI: 1.8 L/min/m2 Distal aorta occlusion Patent aorto-bifem bypass Left renal artery occlusion Chest pain free, with stable blood pressures. However, hemodynamically, he is in pulmonary edema and probably impending cardiogenic shock. PCI to LAD/Diag would only be feasible with atherectomy and hemodynamic support. Prior aorto-bifem bypass makes femoral access for Impella support not ideal, given the risk of graft occlusion/infection etc. I dicussed the case my interventional colleagues, as well as vascular surgeon and cardiothoracic surgeon. Given limitations for percutaneous support, his best option was felt to be axillary  5.5 support and CABG. Appreciate Dr. Farley Ly consult, who decided to take the patient for urgent CABG. I spoke with the patient, and his son Zae Kirtz and explained our above recommendations. Nigel Mormon, MD Pager: [phone] Office: [phone]   Result Date: 07/16/2021 Images from the original result were not included. LM: Normal LAD: Prox LAD/D1 95% severely calcified lesion         Distal LAD either has diffuse spasm, diffuse disease, or is simply under-filled Lcx: Diminutive vessel with no significant disease RCA: Super-dominant vessel          Mid focal 60% eccentric lesion          RPDA 30% disease RA: 15 mmHg RV: 55/14 mmHg PA: 56/26 mmHg, mPAP 45 mmHg PCW: 39 mmHg CO: 3.4 L/min CI: 1.8 L/min/m2 Chest pain free, with stable blood pressures. However, hemodynamically, he is in pulmonary edema and probably impending cardiogenic shock. PCI to LAD/Diag would only be feasible with atherectomy and hemodynamic support. Prior aorto-bifem bypass makes femoral access for Impella support not ideal, given the risk of graft occlusion/infection etc. I dicussed the case my interventional colleagues, as well as vascular surgeon and cardiothoracic surgeon. Given limitations for percutaneous support, his best option was felt to be axillary 5.5 support and CABG. Appreciate Dr. Farley Ly consult, who decided to take the patient for urgent CABG. I spoke with the patient, and his son Javante Nilsson and explained our above recommendations. Nigel Mormon, MD Pager: [phone] Office: [phone]  DG Chest Port 1 View  Result Date: Jul 22, 2021 CLINICAL DATA:  Looking for retained needle after heart surgery EXAM: PORTABLE CHEST 1 VIEW COMPARISON:  04/22/2018. FINDINGS: Bilateral chest tubes in place, no pneumothorax. Swan-Ganz catheter tip in the right lower lobe pulmonary artery centrally. Endotracheal tube is approximately 2 cm above the carina. Impella device in place. No visible unexpected  radiopaque foreign body. Specifically, no evidence of needle. Bilateral airspace disease likely reflects edema. IMPRESSION: Changes of CABG. Support devices in expected position as above. No visible retained needle or other  unexpected radiopaque foreign body. Diffuse airspace disease, likely edema. Electronically Signed   By: Rolm Baptise M.D.   On: Jul 28, 2021 03:02   DG C-Arm 1-60 Min  Result Date: July 28, 2021 CLINICAL DATA:  Left ventricular assist device placement, intraoperative examination EXAM: DG C-ARM 1-60 MIN CONTRAST:  None FLUOROSCOPY TIME:  Fluoroscopy Time:  1 hour, 12 minutes, 2 seconds Radiation Exposure Index (if provided by the fluoroscopic device): 771.35 mGy Number of Acquired Spot Images: 1 COMPARISON:  None. FINDINGS: Imaging is extremely limited by fluoroscopic technique. Left ventricular assist device overlies the thoracic spine extends into the left paraspinal region, however, fluoroscopic technique precludes assessment of catheter position. Large caliber cannula are noted within the right paraspinal region, possibly overlying the superior and inferior vena cava. Soft tissue retractors overlie the visualized thorax. IMPRESSION: Extremely limited examination demonstrating a left ventricular assist device overlying the thoracic spine and extending into the left paraspinal region. Electronically Signed   By: Fidela Salisbury M.D.   On: 07/28/2021 03:54   DG C-Arm 1-60 Min  Result Date: Jul 28, 2021 CLINICAL DATA:  Left ventricular assist device placement, intraoperative examination EXAM: DG C-ARM 1-60 MIN CONTRAST:  None FLUOROSCOPY TIME:  Fluoroscopy Time:  1 hour, 12 minutes, 2 seconds Radiation Exposure Index (if provided by the fluoroscopic device): 771.35 mGy Number of Acquired Spot Images: 1 COMPARISON:  None. FINDINGS: Imaging is extremely limited by fluoroscopic technique. Left ventricular assist device overlies the thoracic spine extends into the left paraspinal region, however,  fluoroscopic technique precludes assessment of catheter position. Large caliber cannula are noted within the right paraspinal region, possibly overlying the superior and inferior vena cava. Soft tissue retractors overlie the visualized thorax. IMPRESSION: Extremely limited examination demonstrating a left ventricular assist device overlying the thoracic spine and extending into the left paraspinal region. Electronically Signed   By: Fidela Salisbury M.D.   On: 07-28-21 03:43   ECHO INTRAOPERATIVE TEE  Result Date: 07/14/2021  *INTRAOPERATIVE TRANSESOPHAGEAL REPORT *  Patient Name:   ZAMIER EGGEBRECHT Date of Exam: 08/04/2021 Medical Rec #:  [id number]         Height:       66.0 in Accession #:    [id number]        Weight:       177.0 lb Date of Birth:  26-Oct-1946         BSA:          1.90 m Patient Age:    84 years          BP:           83/56 mmHg Patient Gender: M                 HR:           73 bpm. Exam Location:  Inpatient Transesophogeal exam was perform intraoperatively during surgical procedure. Patient was closely monitored under general anesthesia during the entirety of examination. Indications:     CABG Performing Phys: [place] Diagnosing Phys: Albertha Ghee MD Complications: No known complications during this procedure. POST-OP IMPRESSIONS _ Left Ventricle: has severely reduced systolic function, with an ejection fraction of 10%. The cavity size was moderately dilated. The wall motion is abnormal with regional variation. The LV continues to have akinesis in the anterior and septal walls. There is severe HK bordering on akinesis in the inferior and lateral walls. _ Aortic Valve: There is an impella assist device seen crossing the AV. Properly positioned with the tip/inflow  orifice measured at 4cm from the AV annulus. The pre-existing AI now appears moderate by CFD and continuous. PRE-OP FINDINGS  Left Ventricle: The left ventricle severe HK. 20%. The cavity size was mildly dilated.  There is mildly increased left ventricular wall thickness. Findings are consistent with ischemic cardiomyopathy. Severe akinesis of the left ventricular, mid-apical anterior wall and anteroseptal wall. Mild hypokinesis of the left ventricular, entire inferior wall and lateral wall. No intracardiac thrombi or masses were visualized. There is mild concentric left ventricular hypertrophy. Right Ventricle: The right ventricle has mildly reduced systolic function. The cavity was normal. There is no increase in right ventricular wall thickness. Left Atrium: Left atrial size was normal in size. No left atrial/left atrial appendage thrombus was detected. Right Atrium: Right atrial size was normal in size. Interatrial Septum: No atrial level shunt detected by color flow Doppler. Pericardium: There is no evidence of pericardial effusion. Mitral Valve: The mitral valve is normal in structure. Mitral valve regurgitation is mild by color flow Doppler. There is No evidence of mitral stenosis. Tricuspid Valve: The tricuspid valve was normal in structure. Tricuspid valve regurgitation is mild by color flow Doppler. No evidence of tricuspid stenosis is present. Aortic Valve: The aortic valve is tricuspid Aortic valve regurgitation is mild by color flow Doppler. The jet is posteriorly-directed. 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 There is moderate thickening and moderate calcification present on the aortic valve non-coronary, left coronary and right coronary cusps with severely decreased mobility. The non-coronary cusp is fixed in a closed position. There is some limited mobility  in both the right and left coronary cusps. Pulmonic Valve: The pulmonic valve was normal in structure. Pulmonic valve regurgitation is trivial by color flow Doppler. Aorta: The aortic root is normal in size and structure. There is evidence of plaque in the ascending aorta, aortic arch and descending aorta; Grade II, measuring 2-68mm in size.  +------------------+------------++ AORTIC VALVE                   +------------------+------------++ AV Vmax:          218.00 cm/s  +------------------+------------++ AV Vmean:         148.000 cm/s +------------------+------------++ AV VTI:           0.493 m      +------------------+------------++ AV Peak Grad:     19.0 mmHg    +------------------+------------++ AV Mean Grad:     10.0 mmHg    +------------------+------------++ LVOT Vmax:        65.70 cm/s   +------------------+------------++ LVOT Vmean:       46.700 cm/s  +------------------+------------++ LVOT VTI:         0.138 m      +------------------+------------++ LVOT/AV VTI ratio:0.28         +------------------+------------++  +-------------+------+ SHUNTS              +-------------+------+ Systemic VTI:0.14 m +-------------+------+  Albertha Ghee MD Electronically signed by Albertha Ghee MD Signature Date/Time: 07/14/2021/8:48:59 AM    Final       FOLLOW UP PLANS AND APPOINTMENTS  Allergies as of 2021-08-04       Reactions   Penicillin G Anaphylaxis   Enalapril Cough   Iodinated Diagnostic Agents Hives   Pt developed itching and then hives on back after CT scan 05/24/21   Lisinopril Cough   Penicillins Rash   Has patient had a PCN reaction causing immediate rash, facial/tongue/throat swelling, SOB or lightheadedness with hypotension: Yes Has  patient had a PCN reaction causing severe rash involving mucus membranes or skin necrosis: Yes Has patient had a PCN reaction that required hospitalization No Has patient had a PCN reaction occurring within the last 10 years: No If all of the above answers are "NO", then may proceed with Cephalosporin use.        Medication List     ASK your doctor about these medications    acetaminophen 325 MG tablet Commonly known as: TYLENOL Take 650 mg by mouth every 6 (six) hours as needed. 2 tablets as needed for pain.   allopurinol 100 MG  tablet Commonly known as: ZYLOPRIM Take 100 mg by mouth daily. Take 2 tablets by mouth in the morning   ascorbic acid 500 MG tablet Commonly known as: VITAMIN C Take 500 mg by mouth 2 (two) times daily. Take one tablet by mouth twice daily   aspirin EC 81 MG tablet Take 81 mg by mouth daily.   calcium carbonate 500 MG chewable tablet Commonly known as: TUMS - dosed in mg elemental calcium Chew 1 tablet by mouth as needed.   carvedilol 25 MG tablet Commonly known as: COREG Take 0.5 tablets (12.5 mg total) by mouth 2 (two) times daily with a meal.   Cholecalciferol 50 MCG (2000 UT) Tabs Take 2,000 Units by mouth every other day. Take 1 tablet by mouth daily.   furosemide 20 MG tablet Commonly known as: LASIX Take 1 tablet (20 mg total) by mouth daily.   losartan 25 MG tablet Commonly known as: COZAAR Take 25 mg by mouth daily.   methimazole 10 MG tablet Commonly known as: TAPAZOLE Take 10 mg by mouth daily. Take one tablet by mouth daily.   Oxcarbazepine 300 MG tablet Commonly known as: TRILEPTAL Take 300 mg by mouth 2 (two) times daily. Take one half tablet by mouth once a day and take one tablet at bedtime for nerve pain.   predniSONE 50 MG tablet Commonly known as: DELTASONE Take one po 13 hours prior to CT, one po 7 hours prior to CT, and 1 hour prior to CT. Please also take Benadryl 50 mg (OTC) one hour prior to CT.   simvastatin 40 MG tablet Commonly known as: ZOCOR Take 20 mg by mouth daily.   tiotropium 18 MCG inhalation capsule Commonly known as: SPIRIVA Place 1.25 mcg into inhaler and inhale as needed. Inhale 2 puffs by mouth once a day.   triamcinolone cream 0.1 % Commonly known as: KENALOG Apply 1 application topically 2 (two) times daily as needed (for ezcema).           Nigel Mormon, MD Pager: [phone] Office: [phone]

## 2021-08-10 NOTE — Progress Notes (Signed)
      [address]       [place] [address]             [phone]      Mr. Mahone presented earlier today with STEMI with cardiogenic shock. In cath lab LVEDP was 40. Unable to place Impella to support PCI due to vascular disease. 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  Echo showed anteroseptal akinesis. EF ~ 20%  CABG x 3, with good grafts although LAD was a very small diffusely diseased vessel distal to the anastomosis. 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 Failed to wean from pump. Unable to place a 5.5 Impella due to small vessels and severe disease.  Dr. Haroldine Laws and Patwardhan were consulted to assist with Impella placement as the wire was difficult to pass in setting of mild to moderate AS.  Ultimately we were able to place a CP, but when weaned with the Impella, milrinone, epi and norepi, he was unable to maintain adequate cardiac output and BP. Required intermittent boluses of epi and calcium to maintain pulsatility.  He was brought to ICU intubated and unstable so family could see him before he passed.  After discussion with family and they had an opportunity to see him, support was withdrawn due to medical futility.  Died immediately after pacer turned off. Time of death [place]. Roxan Hockey, MD [facility] [phone]

## 2021-08-10 NOTE — Progress Notes (Signed)
Assisted Dr. Roxan Hockey with Impella placement. Unable to advance Impella 5.0 through right subclavian in spite of multiple attempts by Dr. Haroldine Laws and me. Successfully placed Impella CP.   Patient remains in OR, critically ill.   Critical care time: 60 min.   Vernell Leep, MD

## 2021-08-10 DEATH — deceased

## 2021-09-30 ENCOUNTER — Ambulatory Visit: Payer: Self-pay | Admitting: Radiation Oncology

## 2022-11-04 IMAGING — RF DG CHEST 1V
1 series · 1 of 1 positions shown · non-contrast
Comparison: None.

CLINICAL DATA: Left ventricular assist device placement,
intraoperative examination

EXAM:
CHEST  1 VIEW

[Series 1: run · 1 of 1 slices shown]
[im 1/1]
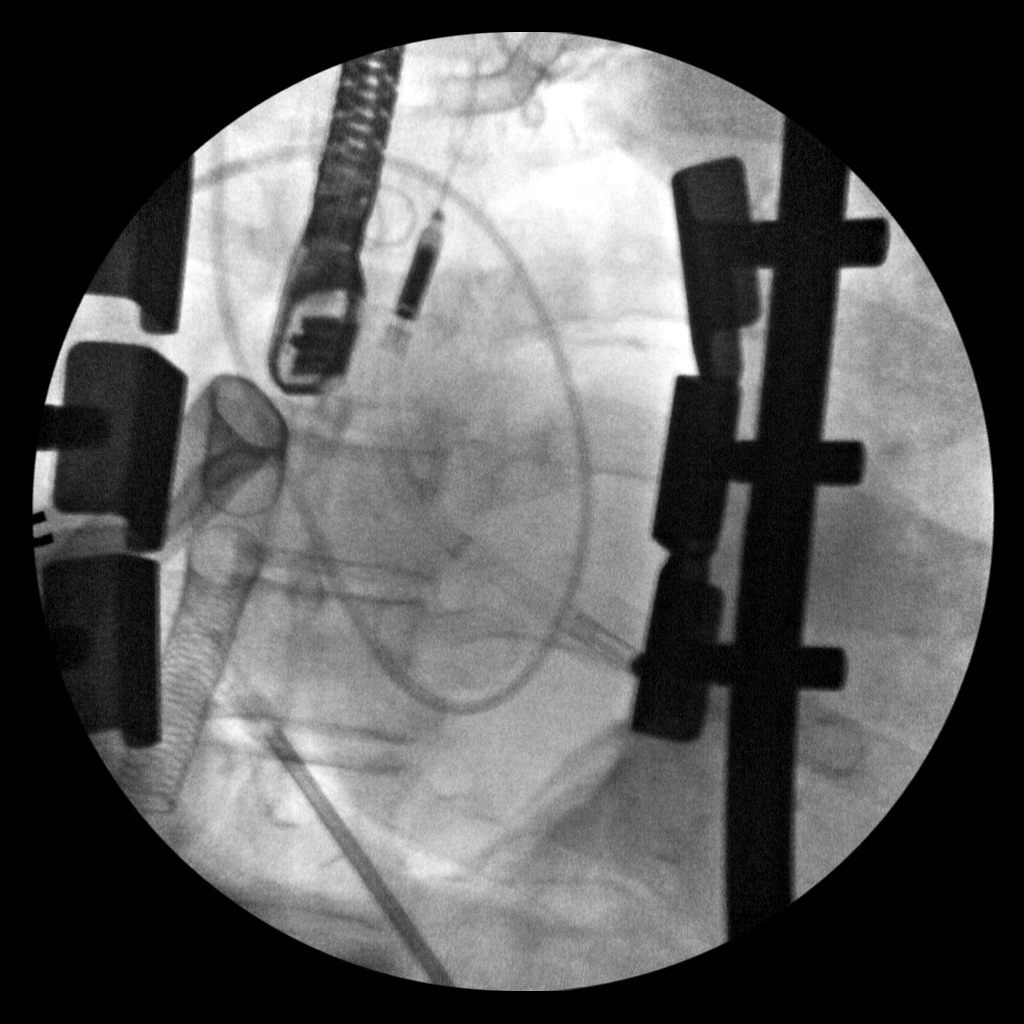

[1 of 1 positions shown; findings below may reference images not displayed]

FINDINGS: Fluoroscopy Time: 1 hour, 12 minutes, 2 seconds

Radiation Exposure Index (if provided by the fluoroscopic device):
771.35 mGy

Number of Acquired Spot Images: 1

Imaging is extremely limited by fluoroscopic technique. Left
ventricular assist device overlies the thoracic spine extends into
the left paraspinal region, however, fluoroscopic technique
precludes assessment of catheter position. Large caliber cannula are
noted within the right paraspinal region, possibly overlying the
superior and inferior vena cava. Soft tissue retractors overlie the
visualized thorax.
IMPRESSION: Extremely limited examination demonstrating a left ventricular
assist device overlying the thoracic spine and extending into the
left paraspinal region.

## 2022-11-04 IMAGING — RF DG C-ARM 1-60 MIN
1 series · 1 of 1 positions shown · IV contrast (agent unspecified)
Comparison: None.

CLINICAL DATA: Left ventricular assist device placement,
intraoperative examination

EXAM:
DG C-ARM 1-60 MIN
CONTRAST:  None
FLUOROSCOPY TIME:  Fluoroscopy Time:  1 hour, 12 minutes, 2 seconds
Radiation Exposure Index (if provided by the fluoroscopic device):
771.35 mGy
Number of Acquired Spot Images: 1

[Series 1: run · 1 of 1 slices shown]
[im 1/1]
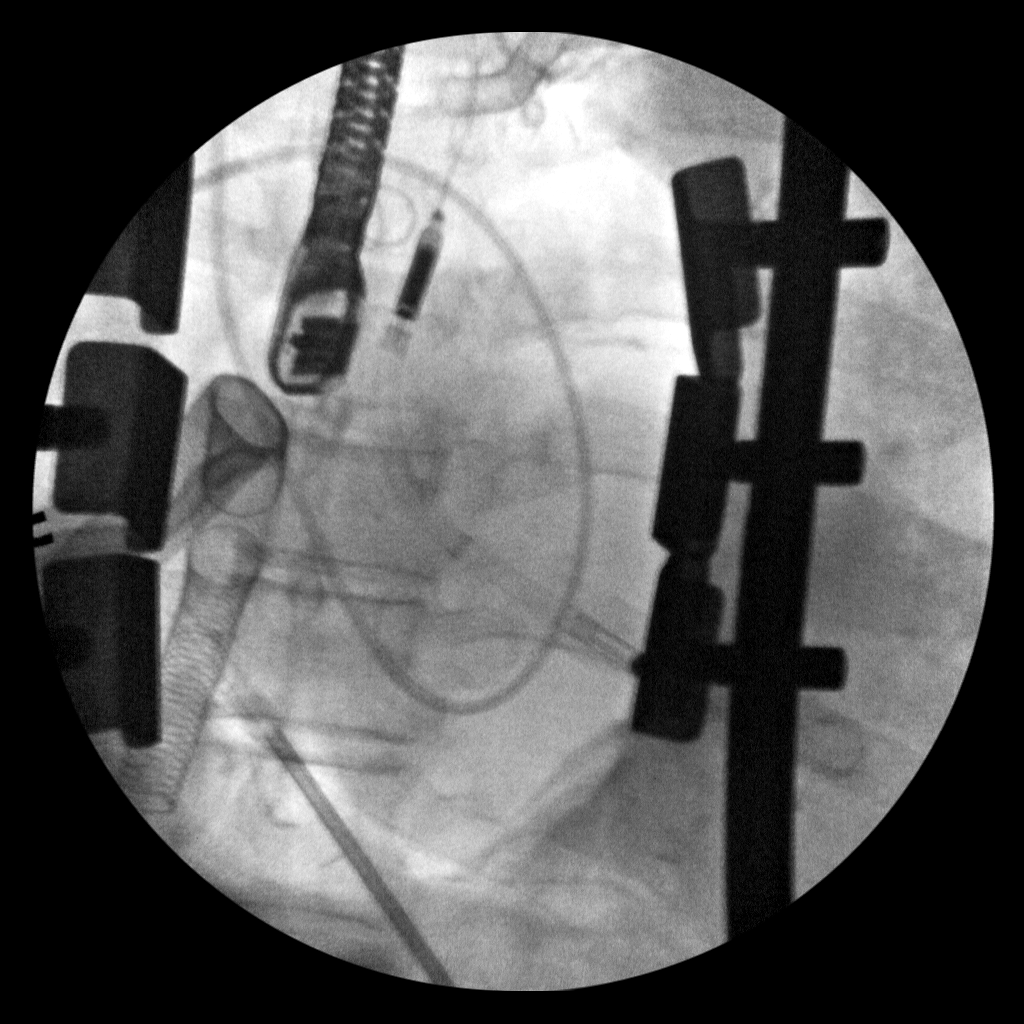

[1 of 1 positions shown; findings below may reference images not displayed]

FINDINGS: Imaging is extremely limited by fluoroscopic technique. Left
ventricular assist device overlies the thoracic spine extends into
the left paraspinal region, however, fluoroscopic technique
precludes assessment of catheter position. Large caliber cannula are
noted within the right paraspinal region, possibly overlying the
superior and inferior vena cava. Soft tissue retractors overlie the
visualized thorax.
IMPRESSION: Extremely limited examination demonstrating a left ventricular
assist device overlying the thoracic spine and extending into the
left paraspinal region.
